# Patient Record
Sex: Female | Born: 1965 | Race: Black or African American | Hispanic: No | Smoking: Never smoker
Health system: Southern US, Community
[De-identification: ages and names within clinical notes are randomized; demographics above are authoritative.]

## PROBLEM LIST (undated history)

## (undated) DIAGNOSIS — E039 Hypothyroidism, unspecified: Secondary | ICD-10-CM

## (undated) DIAGNOSIS — I10 Essential (primary) hypertension: Secondary | ICD-10-CM

## (undated) DIAGNOSIS — E079 Disorder of thyroid, unspecified: Secondary | ICD-10-CM

---

## 2014-02-06 DIAGNOSIS — R7989 Other specified abnormal findings of blood chemistry: Secondary | ICD-10-CM

## 2014-02-06 DIAGNOSIS — E785 Hyperlipidemia, unspecified: Secondary | ICD-10-CM | POA: Insufficient documentation

## 2014-02-06 DIAGNOSIS — E039 Hypothyroidism, unspecified: Secondary | ICD-10-CM | POA: Insufficient documentation

## 2014-02-06 DIAGNOSIS — I10 Essential (primary) hypertension: Secondary | ICD-10-CM | POA: Insufficient documentation

## 2019-08-08 DIAGNOSIS — E6609 Other obesity due to excess calories: Secondary | ICD-10-CM | POA: Insufficient documentation

## 2021-03-18 ENCOUNTER — Emergency Department (HOSPITAL_BASED_OUTPATIENT_CLINIC_OR_DEPARTMENT_OTHER): Payer: Managed Care, Other (non HMO)

## 2021-03-18 ENCOUNTER — Other Ambulatory Visit: Payer: Self-pay

## 2021-03-18 ENCOUNTER — Encounter (HOSPITAL_BASED_OUTPATIENT_CLINIC_OR_DEPARTMENT_OTHER): Payer: Self-pay

## 2021-03-18 ENCOUNTER — Inpatient Hospital Stay (HOSPITAL_BASED_OUTPATIENT_CLINIC_OR_DEPARTMENT_OTHER)
Admission: EM | Admit: 2021-03-18 | Discharge: 2021-03-22 | DRG: 690 | Disposition: A | Discharge status: Home / Self Care | Payer: Managed Care, Other (non HMO) | Attending: Internal Medicine | Admitting: Internal Medicine

## 2021-03-18 DIAGNOSIS — Z1612 Extended spectrum beta lactamase (ESBL) resistance: Secondary | ICD-10-CM | POA: Diagnosis present

## 2021-03-18 DIAGNOSIS — R112 Nausea with vomiting, unspecified: Secondary | ICD-10-CM

## 2021-03-18 DIAGNOSIS — E039 Hypothyroidism, unspecified: Secondary | ICD-10-CM | POA: Diagnosis present

## 2021-03-18 DIAGNOSIS — E876 Hypokalemia: Secondary | ICD-10-CM | POA: Diagnosis present

## 2021-03-18 DIAGNOSIS — N179 Acute kidney failure, unspecified: Secondary | ICD-10-CM | POA: Diagnosis present

## 2021-03-18 DIAGNOSIS — I10 Essential (primary) hypertension: Secondary | ICD-10-CM | POA: Diagnosis present

## 2021-03-18 DIAGNOSIS — B9620 Unspecified Escherichia coli [E. coli] as the cause of diseases classified elsewhere: Secondary | ICD-10-CM | POA: Diagnosis present

## 2021-03-18 DIAGNOSIS — Z20822 Contact with and (suspected) exposure to covid-19: Secondary | ICD-10-CM | POA: Diagnosis present

## 2021-03-18 DIAGNOSIS — Z79890 Hormone replacement therapy (postmenopausal): Secondary | ICD-10-CM

## 2021-03-18 DIAGNOSIS — N12 Tubulo-interstitial nephritis, not specified as acute or chronic: Principal | ICD-10-CM | POA: Diagnosis present

## 2021-03-18 DIAGNOSIS — R7989 Other specified abnormal findings of blood chemistry: Secondary | ICD-10-CM

## 2021-03-18 DIAGNOSIS — R7401 Elevation of levels of liver transaminase levels: Secondary | ICD-10-CM | POA: Diagnosis present

## 2021-03-18 DIAGNOSIS — E860 Dehydration: Secondary | ICD-10-CM | POA: Diagnosis present

## 2021-03-18 DIAGNOSIS — Z79899 Other long term (current) drug therapy: Secondary | ICD-10-CM

## 2021-03-18 DIAGNOSIS — E871 Hypo-osmolality and hyponatremia: Secondary | ICD-10-CM | POA: Diagnosis present

## 2021-03-18 DIAGNOSIS — A499 Bacterial infection, unspecified: Secondary | ICD-10-CM

## 2021-03-18 HISTORY — DX: Hypothyroidism, unspecified: E03.9

## 2021-03-18 HISTORY — DX: Essential (primary) hypertension: I10

## 2021-03-18 HISTORY — DX: Disorder of thyroid, unspecified: E07.9

## 2021-03-18 LAB — URINALYSIS, ROUTINE W REFLEX MICROSCOPIC
Bilirubin Urine: NEGATIVE
Glucose, UA: NEGATIVE mg/dL
Ketones, ur: 40 mg/dL — AB
Nitrite: POSITIVE — AB
Protein, ur: 100 mg/dL — AB
Specific Gravity, Urine: 1.01 (ref 1.005–1.030)
pH: 5.5 (ref 5.0–8.0)

## 2021-03-18 LAB — CBC WITH DIFFERENTIAL/PLATELET
Abs Immature Granulocytes: 0.1 10*3/uL — ABNORMAL HIGH (ref 0.00–0.07)
Basophils Absolute: 0.1 10*3/uL (ref 0.0–0.1)
Basophils Relative: 0 %
Eosinophils Absolute: 0 10*3/uL (ref 0.0–0.5)
Eosinophils Relative: 0 %
HCT: 38.5 % (ref 36.0–46.0)
Hemoglobin: 12.8 g/dL (ref 12.0–15.0)
Immature Granulocytes: 1 %
Lymphocytes Relative: 15 %
Lymphs Abs: 3.1 10*3/uL (ref 0.7–4.0)
MCH: 25.7 pg — ABNORMAL LOW (ref 26.0–34.0)
MCHC: 33.2 g/dL (ref 30.0–36.0)
MCV: 77.3 fL — ABNORMAL LOW (ref 80.0–100.0)
Monocytes Absolute: 2.7 10*3/uL — ABNORMAL HIGH (ref 0.1–1.0)
Monocytes Relative: 13 %
Neutro Abs: 14.6 10*3/uL — ABNORMAL HIGH (ref 1.7–7.7)
Neutrophils Relative %: 71 %
Platelets: 369 10*3/uL (ref 150–400)
RBC: 4.98 MIL/uL (ref 3.87–5.11)
RDW: 14 % (ref 11.5–15.5)
WBC: 20.6 10*3/uL — ABNORMAL HIGH (ref 4.0–10.5)
nRBC: 0 % (ref 0.0–0.2)

## 2021-03-18 LAB — COMPREHENSIVE METABOLIC PANEL
ALT: 56 U/L — ABNORMAL HIGH (ref 0–44)
AST: 68 U/L — ABNORMAL HIGH (ref 15–41)
Albumin: 3.8 g/dL (ref 3.5–5.0)
Alkaline Phosphatase: 67 U/L (ref 38–126)
Anion gap: 16 — ABNORMAL HIGH (ref 5–15)
BUN: 9 mg/dL (ref 6–20)
CO2: 22 mmol/L (ref 22–32)
Calcium: 9.1 mg/dL (ref 8.9–10.3)
Chloride: 91 mmol/L — ABNORMAL LOW (ref 98–111)
Creatinine, Ser: 1.1 mg/dL — ABNORMAL HIGH (ref 0.44–1.00)
GFR, Estimated: 59 mL/min — ABNORMAL LOW (ref >60)
Glucose, Bld: 112 mg/dL — ABNORMAL HIGH (ref 70–99)
Potassium: 2.8 mmol/L — ABNORMAL LOW (ref 3.5–5.1)
Sodium: 129 mmol/L — ABNORMAL LOW (ref 135–145)
Total Bilirubin: 0.6 mg/dL (ref 0.3–1.2)
Total Protein: 8.5 g/dL — ABNORMAL HIGH (ref 6.5–8.1)

## 2021-03-18 LAB — RESP PANEL BY RT-PCR (FLU A&B, COVID) ARPGX2
Influenza A by PCR: NEGATIVE
Influenza B by PCR: NEGATIVE
SARS Coronavirus 2 by RT PCR: NEGATIVE

## 2021-03-18 LAB — URINALYSIS, MICROSCOPIC (REFLEX): WBC, UA: >50 WBC/hpf (ref 0–5)

## 2021-03-18 LAB — MAGNESIUM: Magnesium: 2.1 mg/dL (ref 1.7–2.4)

## 2021-03-18 LAB — LIPASE, BLOOD: Lipase: 23 U/L (ref 11–51)

## 2021-03-18 MED ORDER — MORPHINE SULFATE (PF) 4 MG/ML IV SOLN
4.0000 mg | Freq: Once | INTRAVENOUS | Status: AC
  Administered 2021-03-18: 4 mg via INTRAVENOUS
  Filled 2021-03-18: qty 1

## 2021-03-18 MED ORDER — MAGNESIUM OXIDE -MG SUPPLEMENT 400 (240 MG) MG PO TABS
400.0000 mg | ORAL_TABLET | Freq: Once | ORAL | Status: AC
  Administered 2021-03-18: 400 mg via ORAL
  Filled 2021-03-18: qty 1

## 2021-03-18 MED ORDER — SODIUM CHLORIDE 0.9 % IV BOLUS
1000.0000 mL | Freq: Once | INTRAVENOUS | Status: AC
  Administered 2021-03-18: 1000 mL via INTRAVENOUS

## 2021-03-18 MED ORDER — POTASSIUM CHLORIDE 10 MEQ/100ML IV SOLN
10.0000 meq | Freq: Once | INTRAVENOUS | Status: AC
  Administered 2021-03-18: 10 meq via INTRAVENOUS
  Filled 2021-03-18: qty 100

## 2021-03-18 MED ORDER — IOHEXOL 300 MG/ML  SOLN
100.0000 mL | Freq: Once | INTRAMUSCULAR | Status: AC | PRN
  Administered 2021-03-18: 100 mL via INTRAVENOUS

## 2021-03-18 MED ORDER — DICYCLOMINE HCL 10 MG PO CAPS
10.0000 mg | ORAL_CAPSULE | Freq: Once | ORAL | Status: AC
  Administered 2021-03-18: 10 mg via ORAL
  Filled 2021-03-18: qty 1

## 2021-03-18 MED ORDER — POTASSIUM CHLORIDE CRYS ER 20 MEQ PO TBCR
40.0000 meq | EXTENDED_RELEASE_TABLET | Freq: Once | ORAL | Status: AC
  Administered 2021-03-18: 40 meq via ORAL
  Filled 2021-03-18: qty 2

## 2021-03-18 MED ORDER — ONDANSETRON HCL 4 MG/2ML IJ SOLN
4.0000 mg | Freq: Once | INTRAMUSCULAR | Status: AC
  Administered 2021-03-18: 4 mg via INTRAVENOUS
  Filled 2021-03-18: qty 2

## 2021-03-18 MED ORDER — SODIUM CHLORIDE 0.9 % IV SOLN
2.0000 g | Freq: Once | INTRAVENOUS | Status: AC
  Administered 2021-03-18: 2 g via INTRAVENOUS
  Filled 2021-03-18: qty 20

## 2021-03-18 MED ORDER — FAMOTIDINE IN NACL 20-0.9 MG/50ML-% IV SOLN
20.0000 mg | Freq: Once | INTRAVENOUS | Status: AC
  Administered 2021-03-18: 20 mg via INTRAVENOUS
  Filled 2021-03-18: qty 50

## 2021-03-18 NOTE — ED Triage Notes (Signed)
Pt c/o abd cramps, n/v x 4 days-denies diarrhea-NAD-steady gait

## 2021-03-18 NOTE — ED Notes (Signed)
Pt gone to CT 

## 2021-03-18 NOTE — ED Provider Notes (Signed)
[facility] EMERGENCY DEPARTMENT Provider Note   CSN: [id number] Arrival date & time: 03/18/21  2111     History Chief Complaint  Patient presents with   Abdominal Pain    Kortlynn Poust is a 55 y.o. female.  This is a 55 y.o. female with significant medical history as below, including htn who presents to the ED with complaint of abdominal pain, cramping, nausea or vomiting.  Location: Abdomen diffusely Duration: 1 week Onset:  sudden Timing:  intermittent Description: Cramping, aching Severity: Mild Exacerbating/Alleviating Factors: Worse with oral intake. Associated Symptoms: Intermittent fevers and chills, nausea and vomiting, reduced oral intake over the past 24 hours.  Patient feels nauseated approximately 2 to 3 minutes after oral intake, malaise  Pertinent Negatives:  no cp/ dyspnea, change to urination, no HA,  neuro changes Context: patient thinks she may have eaten something that set off her symptoms, no recent travel or sick contacts.    The history is provided by the patient. No language interpreter was used.  Abdominal Pain Associated symptoms: chills, constipation, fatigue, fever, nausea and vomiting   Associated symptoms: no chest pain, no cough, no hematuria and no shortness of breath       Past Medical History:  Diagnosis Date   Hypertension    Hypothyroid    Thyroid disease     There are no problems to display for this patient.   Past Surgical History:  Procedure Laterality Date   CESAREAN SECTION       OB History   No obstetric history on file.     No family history on file.  Social History   Tobacco Use   Smoking status: Never   Smokeless tobacco: Never  Vaping Use   Vaping Use: Never used  Substance Use Topics   Alcohol use: Never   Drug use: Never    Home Medications Prior to Admission medications   Not on File    Allergies    Patient has no known allergies.  Review of Systems   Review of Systems   Constitutional:  Positive for chills, fatigue and fever.  HENT:  Negative for facial swelling and trouble swallowing.   Eyes:  Negative for photophobia and visual disturbance.  Respiratory:  Negative for cough and shortness of breath.   Cardiovascular:  Negative for chest pain and palpitations.  Gastrointestinal:  Positive for abdominal pain, constipation, nausea and vomiting.  Endocrine: Negative for polydipsia and polyuria.  Genitourinary:  Negative for difficulty urinating and hematuria.  Musculoskeletal:  Negative for gait problem and joint swelling.  Skin:  Negative for pallor and rash.  Neurological:  Negative for syncope and headaches.  Psychiatric/Behavioral:  Negative for agitation and confusion.    Physical Exam Updated Vital Signs BP 139/84   Pulse 80   Temp 99 F (37.2 C) (Oral)   Resp 18   Ht 5\' 6"  (1.676 m)   Wt 95.7 kg   SpO2 100%   BMI 34.06 kg/m   Physical Exam Vitals and nursing note reviewed.  Constitutional:      General: She is not in acute distress.    Appearance: Normal appearance. She is well-developed.  HENT:     Head: Normocephalic and atraumatic.     Right Ear: External ear normal.     Left Ear: External ear normal.     Nose: Nose normal.     Mouth/Throat:     Mouth: Mucous membranes are moist.  Eyes:     General: No  scleral icterus.       Right eye: No discharge.        Left eye: No discharge.  Cardiovascular:     Rate and Rhythm: Normal rate and regular rhythm.     Pulses: Normal pulses.     Heart sounds: Normal heart sounds.  Pulmonary:     Effort: Pulmonary effort is normal. No respiratory distress.     Breath sounds: Normal breath sounds.  Abdominal:     General: Abdomen is flat.     Tenderness: There is generalized abdominal tenderness. There is right CVA tenderness and left CVA tenderness.  Musculoskeletal:        General: Normal range of motion.     Cervical back: Normal range of motion.     Right lower leg: No edema.      Left lower leg: No edema.  Skin:    General: Skin is warm and dry.     Capillary Refill: Capillary refill takes less than 2 seconds.  Neurological:     Mental Status: She is alert.  Psychiatric:        Mood and Affect: Mood normal.        Behavior: Behavior normal.    ED Results / Procedures / Treatments   Labs (all labs ordered are listed, but only abnormal results are displayed) Labs Reviewed  CBC WITH DIFFERENTIAL/PLATELET - Abnormal; Notable for the following components:      Result Value   WBC 20.6 (*)    MCV 77.3 (*)    MCH 25.7 (*)    Neutro Abs 14.6 (*)    Monocytes Absolute 2.7 (*)    Abs Immature Granulocytes 0.10 (*)    All other components within normal limits  COMPREHENSIVE METABOLIC PANEL - Abnormal; Notable for the following components:   Sodium 129 (*)    Potassium 2.8 (*)    Chloride 91 (*)    Glucose, Bld 112 (*)    Creatinine, Ser 1.10 (*)    Total Protein 8.5 (*)    AST 68 (*)    ALT 56 (*)    GFR, Estimated 59 (*)    Anion gap 16 (*)    All other components within normal limits  URINALYSIS, ROUTINE W REFLEX MICROSCOPIC - Abnormal; Notable for the following components:   APPearance CLOUDY (*)    Hgb urine dipstick MODERATE (*)    Ketones, ur 40 (*)    Protein, ur 100 (*)    Nitrite POSITIVE (*)    Leukocytes,Ua LARGE (*)    All other components within normal limits  URINALYSIS, MICROSCOPIC (REFLEX) - Abnormal; Notable for the following components:   Bacteria, UA MANY (*)    All other components within normal limits  RESP PANEL BY RT-PCR (FLU A&B, COVID) ARPGX2  URINE CULTURE  LIPASE, BLOOD  MAGNESIUM    EKG None  Radiology CT ABDOMEN PELVIS W CONTRAST  Result Date: 03/18/2021 CLINICAL DATA:  Acute abdominal pain. EXAM: CT ABDOMEN AND PELVIS WITH CONTRAST TECHNIQUE: Multidetector CT imaging of the abdomen and pelvis was performed using the standard protocol following bolus administration of intravenous contrast. CONTRAST:  OMNIPAQUE  IOHEXOL 300 MG/ML  SOLN COMPARISON:  None. FINDINGS: Lower chest: No acute abnormality. Hepatobiliary: No focal liver abnormality is seen. No gallstones, gallbladder wall thickening, or biliary dilatation. Pancreas: Unremarkable. No pancreatic ductal dilatation or surrounding inflammatory changes. Spleen: Normal in size without focal abnormality. Adrenals/Urinary Tract: On delayed imaging there is minimal patchy cortical hypodensity in both  kidneys. There is mild left perinephric fat stranding. There is mild bilateral ureteral enhancement. There is no hydronephrosis or perinephric fluid collection. The bladder is within normal limits. Stomach/Bowel: Stomach is within normal limits. Appendix is not visualized. No evidence of bowel wall thickening, distention, or inflammatory changes. Vascular/Lymphatic: No significant vascular findings are present. No enlarged abdominal or pelvic lymph nodes. Reproductive: Uterus and bilateral adnexa are unremarkable. Other: No abdominal wall hernia or abnormality. No abdominopelvic ascites. Musculoskeletal: No acute or significant osseous findings. IMPRESSION: 1. Findings suggestive of bilateral pyelonephritis. No hydronephrosis. If there are no clinical signs for infection, infiltrating renal lesions are not excluded. Electronically Signed   By: Darliss Cheney M.D.   On: 03/18/2021 22:50    Procedures .Critical Care Performed by: Sloan Leiter, DO Authorized by: Sloan Leiter, DO   Critical care provider statement:    Critical care time (minutes):  33   Critical care time was exclusive of:  Separately billable procedures and treating other patients   Critical care was necessary to treat or prevent imminent or life-threatening deterioration of the following conditions:  Dehydration   Critical care was time spent personally by me on the following activities:  Ordering and performing treatments and interventions, ordering and review of laboratory studies, ordering and  review of radiographic studies, pulse oximetry, re-evaluation of patient's condition, review of old charts, obtaining history from patient or surrogate, examination of patient, evaluation of patient's response to treatment and development of treatment plan with patient or surrogate   Care discussed with: admitting provider     Medications Ordered in ED Medications  potassium chloride 10 mEq in 100 mL IVPB (10 mEq Intravenous New Bag/Given 03/18/21 2332)  sodium chloride 0.9 % bolus 1,000 mL (0 mLs Intravenous Stopped 03/18/21 2253)  famotidine (PEPCID) IVPB 20 mg premix (0 mg Intravenous Stopped 03/18/21 2253)  ondansetron (ZOFRAN) injection 4 mg (4 mg Intravenous Given 03/18/21 2203)  dicyclomine (BENTYL) capsule 10 mg (10 mg Oral Given 03/18/21 2203)  morphine 4 MG/ML injection 4 mg (4 mg Intravenous Given 03/18/21 2204)  cefTRIAXone (ROCEPHIN) 2 g in sodium chloride 0.9 % 100 mL IVPB (2 g Intravenous New Bag/Given 03/18/21 2248)  iohexol (OMNIPAQUE) 300 MG/ML solution 100 mL (100 mLs Intravenous Contrast Given 03/18/21 2237)  potassium chloride SA (KLOR-CON) CR tablet 40 mEq (40 mEq Oral Given 03/18/21 2330)  magnesium oxide (MAG-OX) tablet 400 mg (400 mg Oral Given 03/18/21 2331)  sodium chloride 0.9 % bolus 1,000 mL (1,000 mLs Intravenous New Bag/Given 03/18/21 2331)    ED Course  I have reviewed the triage vital signs and the nursing notes.  Pertinent labs & imaging results that were available during my care of the patient were reviewed by me and considered in my medical decision making (see chart for details).    MDM Rules/Calculators/A&P                           CC: abd pain, n/v  This patient complains of symptoms as above; this involves an extensive number of treatment options and is a complaint that carries with it a high risk of complications and morbidity. Vital signs were reviewed. Serious etiologies considered.  Abdomen is soft, not peritoneal.  Record review:   Previous records obtained and reviewed   Additional history obtained from son at bedside  Work up as above, notable for:  Labs & imaging results that were available during my care of the patient  were reviewed by me and considered in my medical decision making.   Significant leukocytosis 20.6, bandemia.  Mild hyponatremia, hypokalemia, mild transaminitis. Anion gap noted, ketones in urine. Pt with poor oral intake over the last few days. Continue IVF.   I ordered imaging studies which included CT abdomen pelvis with IV contrast and I independently visualized and interpreted imaging which showed b/l pyelonephritis   Urinalysis consistent with infection.  Given CT findings there is concern for bilateral pyelonephritis  Management: Patient given Rocephin, IV fluids x2 L.   Reassessment:  Reports she is feeling better after intervention, IV fluids, analgesics and antiemetics.  Discussed laboratory and imaging findings.  Recommend admission at this time for pyelonephritis given patient with poor oral intake for the past few days, abnormal lab findings, dehydration.  Significant difficulty tolerating oral intake.  Patient is agreeable. Plan for admission.      This chart was dictated using voice recognition software.  Despite best efforts to proofread,  errors can occur which can change the documentation meaning.  Final Clinical Impression(s) / ED Diagnoses Final diagnoses:  Pyelonephritis  Hypokalemia  Hyponatremia  Nausea and vomiting, unspecified vomiting type  Transaminitis  Dehydration    Rx / DC Orders ED Discharge Orders     None        Sloan Leiter, DO 03/18/21 2333

## 2021-03-18 NOTE — Plan of Care (Addendum)
55 yo F with bilateral pyelonephritis.  N/V.  WBC 20.6k.  CT shows B pyelo.  Got IVF and rocephin.  [facility] will assume care on arrival to accepting facility. Until arrival, care as per EDP. However, [facility] available 24/7 for questions and assistance.  Nursing staff please page [facility] Admits and Consults [phone]) as soon as the patient arrives the hospital.

## 2021-03-19 DIAGNOSIS — R7401 Elevation of levels of liver transaminase levels: Secondary | ICD-10-CM | POA: Diagnosis present

## 2021-03-19 DIAGNOSIS — A499 Bacterial infection, unspecified: Secondary | ICD-10-CM | POA: Diagnosis not present

## 2021-03-19 DIAGNOSIS — E876 Hypokalemia: Secondary | ICD-10-CM | POA: Diagnosis present

## 2021-03-19 DIAGNOSIS — N12 Tubulo-interstitial nephritis, not specified as acute or chronic: Secondary | ICD-10-CM | POA: Diagnosis present

## 2021-03-19 DIAGNOSIS — E860 Dehydration: Secondary | ICD-10-CM | POA: Diagnosis present

## 2021-03-19 DIAGNOSIS — B9620 Unspecified Escherichia coli [E. coli] as the cause of diseases classified elsewhere: Secondary | ICD-10-CM | POA: Diagnosis present

## 2021-03-19 DIAGNOSIS — N179 Acute kidney failure, unspecified: Secondary | ICD-10-CM | POA: Diagnosis present

## 2021-03-19 DIAGNOSIS — I10 Essential (primary) hypertension: Secondary | ICD-10-CM | POA: Diagnosis present

## 2021-03-19 DIAGNOSIS — Z2239 Carrier of other specified bacterial diseases: Secondary | ICD-10-CM | POA: Diagnosis not present

## 2021-03-19 DIAGNOSIS — E871 Hypo-osmolality and hyponatremia: Secondary | ICD-10-CM | POA: Diagnosis present

## 2021-03-19 DIAGNOSIS — Z79890 Hormone replacement therapy (postmenopausal): Secondary | ICD-10-CM | POA: Diagnosis not present

## 2021-03-19 DIAGNOSIS — R109 Unspecified abdominal pain: Secondary | ICD-10-CM | POA: Diagnosis not present

## 2021-03-19 DIAGNOSIS — E039 Hypothyroidism, unspecified: Secondary | ICD-10-CM | POA: Diagnosis present

## 2021-03-19 DIAGNOSIS — Z79899 Other long term (current) drug therapy: Secondary | ICD-10-CM | POA: Diagnosis not present

## 2021-03-19 DIAGNOSIS — Z1612 Extended spectrum beta lactamase (ESBL) resistance: Secondary | ICD-10-CM | POA: Diagnosis present

## 2021-03-19 DIAGNOSIS — R112 Nausea with vomiting, unspecified: Secondary | ICD-10-CM | POA: Diagnosis not present

## 2021-03-19 DIAGNOSIS — Z20822 Contact with and (suspected) exposure to covid-19: Secondary | ICD-10-CM | POA: Diagnosis present

## 2021-03-19 LAB — CBC WITH DIFFERENTIAL/PLATELET
Abs Immature Granulocytes: 0.08 10*3/uL — ABNORMAL HIGH (ref 0.00–0.07)
Basophils Absolute: 0.1 10*3/uL (ref 0.0–0.1)
Basophils Relative: 0 %
Eosinophils Absolute: 0.3 10*3/uL (ref 0.0–0.5)
Eosinophils Relative: 2 %
HCT: 36.8 % (ref 36.0–46.0)
Hemoglobin: 11.6 g/dL — ABNORMAL LOW (ref 12.0–15.0)
Immature Granulocytes: 1 %
Lymphocytes Relative: 14 %
Lymphs Abs: 2.3 10*3/uL (ref 0.7–4.0)
MCH: 25.3 pg — ABNORMAL LOW (ref 26.0–34.0)
MCHC: 31.5 g/dL (ref 30.0–36.0)
MCV: 80.2 fL (ref 80.0–100.0)
Monocytes Absolute: 2.2 10*3/uL — ABNORMAL HIGH (ref 0.1–1.0)
Monocytes Relative: 13 %
Neutro Abs: 12.1 10*3/uL — ABNORMAL HIGH (ref 1.7–7.7)
Neutrophils Relative %: 70 %
Platelets: 353 10*3/uL (ref 150–400)
RBC: 4.59 MIL/uL (ref 3.87–5.11)
RDW: 14.6 % (ref 11.5–15.5)
WBC: 17 10*3/uL — ABNORMAL HIGH (ref 4.0–10.5)
nRBC: 0 % (ref 0.0–0.2)

## 2021-03-19 LAB — MAGNESIUM: Magnesium: 2.2 mg/dL (ref 1.7–2.4)

## 2021-03-19 LAB — COMPREHENSIVE METABOLIC PANEL
ALT: 49 U/L — ABNORMAL HIGH (ref 0–44)
AST: 55 U/L — ABNORMAL HIGH (ref 15–41)
Albumin: 3.4 g/dL — ABNORMAL LOW (ref 3.5–5.0)
Alkaline Phosphatase: 58 U/L (ref 38–126)
Anion gap: 9 (ref 5–15)
BUN: 7 mg/dL (ref 6–20)
CO2: 23 mmol/L (ref 22–32)
Calcium: 8.6 mg/dL — ABNORMAL LOW (ref 8.9–10.3)
Chloride: 103 mmol/L (ref 98–111)
Creatinine, Ser: 0.92 mg/dL (ref 0.44–1.00)
GFR, Estimated: >60 mL/min (ref >60)
Glucose, Bld: 119 mg/dL — ABNORMAL HIGH (ref 70–99)
Potassium: 3.8 mmol/L (ref 3.5–5.1)
Sodium: 135 mmol/L (ref 135–145)
Total Bilirubin: 0.3 mg/dL (ref 0.3–1.2)
Total Protein: 7.7 g/dL (ref 6.5–8.1)

## 2021-03-19 MED ORDER — ONDANSETRON HCL 4 MG PO TABS: 4.0000 mg | ORAL_TABLET | Freq: Four times a day (QID) | ORAL | Status: DC | PRN

## 2021-03-19 MED ORDER — ACETAMINOPHEN 325 MG PO TABS: 650.0000 mg | ORAL_TABLET | Freq: Four times a day (QID) | ORAL | Status: DC | PRN

## 2021-03-19 MED ORDER — SODIUM CHLORIDE 0.9 % IV SOLN
2.0000 g | INTRAVENOUS | Status: DC
  Administered 2021-03-19 – 2021-03-20 (×2): 2 g via INTRAVENOUS
  Filled 2021-03-19 (×2): qty 20

## 2021-03-19 MED ORDER — MORPHINE SULFATE (PF) 2 MG/ML IV SOLN: 2.0000 mg | INTRAVENOUS | Status: DC | PRN

## 2021-03-19 MED ORDER — MORPHINE SULFATE (PF) 4 MG/ML IV SOLN
4.0000 mg | Freq: Once | INTRAVENOUS | Status: AC
Start: 2021-03-19 — End: 2021-03-19
  Administered 2021-03-19: 4 mg via INTRAVENOUS
  Filled 2021-03-19: qty 1

## 2021-03-19 MED ORDER — LACTATED RINGERS IV SOLN: INTRAVENOUS | Status: DC

## 2021-03-19 MED ORDER — HYDRALAZINE HCL 25 MG PO TABS
25.0000 mg | ORAL_TABLET | Freq: Three times a day (TID) | ORAL | Status: DC | PRN
  Administered 2021-03-21: 25 mg via ORAL
  Filled 2021-03-19: qty 1

## 2021-03-19 MED ORDER — ONDANSETRON HCL 4 MG/2ML IJ SOLN
4.0000 mg | Freq: Once | INTRAMUSCULAR | Status: AC
  Administered 2021-03-19: 4 mg via INTRAVENOUS
  Filled 2021-03-19: qty 2

## 2021-03-19 MED ORDER — OXYCODONE HCL 5 MG PO TABS
5.0000 mg | ORAL_TABLET | ORAL | Status: DC | PRN
Start: 2021-03-19 — End: 2021-03-22
  Administered 2021-03-19 – 2021-03-21 (×5): 5 mg via ORAL
  Filled 2021-03-19 (×5): qty 1

## 2021-03-19 MED ORDER — SODIUM CHLORIDE 0.9 % IV SOLN: INTRAVENOUS | Status: DC

## 2021-03-19 MED ORDER — MELATONIN 3 MG PO TABS
3.0000 mg | ORAL_TABLET | Freq: Every day | ORAL | Status: DC
  Administered 2021-03-19 – 2021-03-21 (×3): 3 mg via ORAL
  Filled 2021-03-19 (×3): qty 1

## 2021-03-19 MED ORDER — ONDANSETRON HCL 4 MG/2ML IJ SOLN: 4.0000 mg | Freq: Four times a day (QID) | INTRAMUSCULAR | Status: DC | PRN

## 2021-03-19 MED ORDER — ACETAMINOPHEN 650 MG RE SUPP: 650.0000 mg | Freq: Four times a day (QID) | RECTAL | Status: DC | PRN

## 2021-03-19 MED ORDER — ENOXAPARIN SODIUM 40 MG/0.4ML IJ SOSY
40.0000 mg | PREFILLED_SYRINGE | INTRAMUSCULAR | Status: DC
  Filled 2021-03-19: qty 0.4

## 2021-03-19 NOTE — ED Notes (Signed)
Pt up to bathroom and back to bed.

## 2021-03-19 NOTE — H&P (Signed)
History and Physical    Michelle Garner SFK:[id number] DOB: 03-18-1966 DOA: 03/18/2021  PCP: Pcp, No  Patient coming from: Home  Chief Complaint: N/V, abdominal pain  HPI: Michelle Garner is a 55 y.o. female with medical history significant of HTN, hypothyroidism. Presenting with N/V and abdominal pain. She reports that she had UTI symptoms about a month ago. She decided to treat those symptoms at home with cranberry juice. She seemed to get better. However, about a week ago, she started having problems with N/V and abdominal pain. She describes a lower abdominal cramping. She has been very nauseous with these events and had a few episodes of vomiting. Her PO intake has been poor. She tried APAP but it didn't help. Her symptoms progressed through yesterday to include chills/sweats at home. She decided she needed to come to the ED for help. She denies any other aggravating or alleviating factors.     ED Course: CT ab/pelvis showed b/l pyelonephritis. She was started on rocephin. [facility] was called for admission.   Review of Systems:  Denies CP, dyspnea, lightheadedness, dizziness, diarrhea, hematuria, hematemesis, hematochezia. Review of systems is otherwise negative for all not mentioned in HPI.   PMHx Past Medical History:  Diagnosis Date   Hypertension    Hypothyroid    Thyroid disease     PSHx Past Surgical History:  Procedure Laterality Date   CESAREAN SECTION      SocHx  reports that she has never smoked. She has never used smokeless tobacco. She reports that she does not drink alcohol and does not use drugs.  No Known Allergies  FamHx No family history on file.  Prior to Admission medications   Not on File    Physical Exam: Vitals:   03/19/21 1000 03/19/21 1030 03/19/21 1330 03/19/21 1602  BP: 122/81 121/77 119/77 (!) 156/96  Pulse: 70 65 69 71  Resp: (!) 23 20 20 18   Temp:    97.9 F (36.6 C)  TempSrc:    Oral  SpO2: 98% 96% 96% 100%  Weight:       Height:        General: 55 y.o. female resting in bed in NAD Eyes: PERRL, normal sclera ENMT: Nares patent w/o discharge, orophaynx clear, dentition normal, ears w/o discharge/lesions/ulcers Neck: Supple, trachea midline Cardiovascular: RRR, +S1, S2, no m/g/r, equal pulses throughout Respiratory: CTABL, no w/r/r, normal WOB GI: BS+, NDNT, no masses noted, no organomegaly noted MSK: No e/c/c Skin: No rashes, bruises, ulcerations noted Neuro: A&O x 3, no focal deficits Psyc: Appropriate interaction and affect, calm/cooperative  Labs on Admission: I have personally reviewed following labs and imaging studies  CBC: Recent Labs  Lab 03/18/21 2150  WBC 20.6*  NEUTROABS 14.6*  HGB 12.8  HCT 38.5  MCV 77.3*  PLT 369   Basic Metabolic Panel: Recent Labs  Lab 03/18/21 2150  NA 129*  K 2.8*  CL 91*  CO2 22  GLUCOSE 112*  BUN 9  CREATININE 1.10*  CALCIUM 9.1  MG 2.1   GFR: Estimated Creatinine Clearance: 65.8 mL/min (A) (by C-G formula based on SCr of 1.1 mg/dL (H)). Liver Function Tests: Recent Labs  Lab 03/18/21 2150  AST 68*  ALT 56*  ALKPHOS 67  BILITOT 0.6  PROT 8.5*  ALBUMIN 3.8   Recent Labs  Lab 03/18/21 2150  LIPASE 23   No results for input(s): AMMONIA in the last 168 hours. Coagulation Profile: No results for input(s): INR, PROTIME in the last 168  hours. Cardiac Enzymes: No results for input(s): CKTOTAL, CKMB, CKMBINDEX, TROPONINI in the last 168 hours. BNP (last 3 results) No results for input(s): PROBNP in the last 8760 hours. HbA1C: No results for input(s): HGBA1C in the last 72 hours. CBG: No results for input(s): GLUCAP in the last 168 hours. Lipid Profile: No results for input(s): CHOL, HDL, LDLCALC, TRIG, CHOLHDL, LDLDIRECT in the last 72 hours. Thyroid Function Tests: No results for input(s): TSH, T4TOTAL, FREET4, T3FREE, THYROIDAB in the last 72 hours. Anemia Panel: No results for input(s): VITAMINB12, FOLATE, FERRITIN, TIBC, IRON,  RETICCTPCT in the last 72 hours. Urine analysis:    Component Value Date/Time   COLORURINE YELLOW 03/18/2021 2137   APPEARANCEUR CLOUDY (A) 03/18/2021 2137   LABSPEC 1.010 03/18/2021 2137   PHURINE 5.5 03/18/2021 2137   GLUCOSEU NEGATIVE 03/18/2021 2137   HGBUR MODERATE (A) 03/18/2021 2137   BILIRUBINUR NEGATIVE 03/18/2021 2137   KETONESUR 40 (A) 03/18/2021 2137   PROTEINUR 100 (A) 03/18/2021 2137   NITRITE POSITIVE (A) 03/18/2021 2137   LEUKOCYTESUR LARGE (A) 03/18/2021 2137    Radiological Exams on Admission: CT ABDOMEN PELVIS W CONTRAST  Result Date: 03/18/2021 CLINICAL DATA:  Acute abdominal pain. EXAM: CT ABDOMEN AND PELVIS WITH CONTRAST TECHNIQUE: Multidetector CT imaging of the abdomen and pelvis was performed using the standard protocol following bolus administration of intravenous contrast. CONTRAST:  OMNIPAQUE IOHEXOL 300 MG/ML  SOLN COMPARISON:  None. FINDINGS: Lower chest: No acute abnormality. Hepatobiliary: No focal liver abnormality is seen. No gallstones, gallbladder wall thickening, or biliary dilatation. Pancreas: Unremarkable. No pancreatic ductal dilatation or surrounding inflammatory changes. Spleen: Normal in size without focal abnormality. Adrenals/Urinary Tract: On delayed imaging there is minimal patchy cortical hypodensity in both kidneys. There is mild left perinephric fat stranding. There is mild bilateral ureteral enhancement. There is no hydronephrosis or perinephric fluid collection. The bladder is within normal limits. Stomach/Bowel: Stomach is within normal limits. Appendix is not visualized. No evidence of bowel wall thickening, distention, or inflammatory changes. Vascular/Lymphatic: No significant vascular findings are present. No enlarged abdominal or pelvic lymph nodes. Reproductive: Uterus and bilateral adnexa are unremarkable. Other: No abdominal wall hernia or abnormality. No abdominopelvic ascites. Musculoskeletal: No acute or significant osseous  findings. IMPRESSION: 1. Findings suggestive of bilateral pyelonephritis. No hydronephrosis. If there are no clinical signs for infection, infiltrating renal lesions are not excluded. Electronically Signed   By: Darliss Cheney M.D.   On: 03/18/2021 22:50    EKG: None obtained in ED  Assessment/Plan Pyelonephritis     - admit to inpt, med-surg     - continue rocephin     - follow UCx     - pain control, anti-emetics  AKI     - baseline Scr is 0.7; this is likely secondary to above     - CT ab/pelvis w/o hydronephrosis     - fluids, follow  HTN     - PRN hydralazine     - says she's on HCTZ at home; hold for now d/t AKI  Hypothyroidism    - resume home synthroid when confirmed  Hypokalemia     - Mg2+ is ok, replace K+  Elevated LFTs     - mild elevation; check hep panel     - CT ab/pelvis w/o hepatic lesion  DVT prophylaxis: lovenox  Code Status: FULL  Family Communication: None at bedside  Consults called: None   Status is: Inpatient  Remains inpatient appropriate because: severity of illness  Abdurrahman Petersheim  A Absalom Aro DO [facility]  If 7PM-7AM, please contact night-coverage [email or web address]  03/19/2021, 4:10 PM

## 2021-03-19 NOTE — ED Notes (Signed)
Pt provided grits and coffee per request. Pt ambulated to bathroom with steady gait, unassisted.

## 2021-03-20 DIAGNOSIS — N12 Tubulo-interstitial nephritis, not specified as acute or chronic: Secondary | ICD-10-CM | POA: Diagnosis not present

## 2021-03-20 DIAGNOSIS — R112 Nausea with vomiting, unspecified: Secondary | ICD-10-CM | POA: Diagnosis not present

## 2021-03-20 LAB — CBC
HCT: 37.8 % (ref 36.0–46.0)
Hemoglobin: 11.9 g/dL — ABNORMAL LOW (ref 12.0–15.0)
MCH: 25.4 pg — ABNORMAL LOW (ref 26.0–34.0)
MCHC: 31.5 g/dL (ref 30.0–36.0)
MCV: 80.8 fL (ref 80.0–100.0)
Platelets: 318 10*3/uL (ref 150–400)
RBC: 4.68 MIL/uL (ref 3.87–5.11)
RDW: 14.7 % (ref 11.5–15.5)
WBC: 13.7 10*3/uL — ABNORMAL HIGH (ref 4.0–10.5)
nRBC: 0 % (ref 0.0–0.2)

## 2021-03-20 LAB — HEPATITIS PANEL, ACUTE
HCV Ab: NONREACTIVE
Hep A IgM: NONREACTIVE
Hep B C IgM: NONREACTIVE
Hepatitis B Surface Ag: NONREACTIVE

## 2021-03-20 LAB — COMPREHENSIVE METABOLIC PANEL
ALT: 47 U/L — ABNORMAL HIGH (ref 0–44)
AST: 51 U/L — ABNORMAL HIGH (ref 15–41)
Albumin: 3.3 g/dL — ABNORMAL LOW (ref 3.5–5.0)
Alkaline Phosphatase: 63 U/L (ref 38–126)
Anion gap: 10 (ref 5–15)
BUN: 7 mg/dL (ref 6–20)
CO2: 25 mmol/L (ref 22–32)
Calcium: 8.6 mg/dL — ABNORMAL LOW (ref 8.9–10.3)
Chloride: 103 mmol/L (ref 98–111)
Creatinine, Ser: 0.82 mg/dL (ref 0.44–1.00)
GFR, Estimated: >60 mL/min (ref >60)
Glucose, Bld: 94 mg/dL (ref 70–99)
Potassium: 3.6 mmol/L (ref 3.5–5.1)
Sodium: 138 mmol/L (ref 135–145)
Total Bilirubin: 0.4 mg/dL (ref 0.3–1.2)
Total Protein: 7.6 g/dL (ref 6.5–8.1)

## 2021-03-20 LAB — HIV ANTIBODY (ROUTINE TESTING W REFLEX): HIV Screen 4th Generation wRfx: NONREACTIVE

## 2021-03-20 MED ORDER — LEVOTHYROXINE SODIUM 25 MCG PO TABS
137.0000 ug | ORAL_TABLET | Freq: Every morning | ORAL | Status: DC
  Administered 2021-03-22: 137 ug via ORAL
  Filled 2021-03-20: qty 1

## 2021-03-20 MED ORDER — POTASSIUM CHLORIDE CRYS ER 20 MEQ PO TBCR
40.0000 meq | EXTENDED_RELEASE_TABLET | Freq: Once | ORAL | Status: AC
  Administered 2021-03-20: 40 meq via ORAL
  Filled 2021-03-20: qty 2

## 2021-03-20 NOTE — Progress Notes (Addendum)
PROGRESS NOTE  Michelle Garner TYO:[id number] DOB: [birth year] DOA: 03/18/2021 PCP: Pcp, No   LOS: 1 day   Brief Narrative / Interim history: 55 year old female with history of HTN, hypothyroidism comes to the hospital with nausea, vomiting, abdominal pain.  She reported having UTI symptoms about a month ago, drank some cranberry juice with improvement.  About a week ago started having nausea, vomiting, abdominal pain, she initially thought she has gastroenteritis and try to continue supportive treatment.  Eventually she was so symptomatic, was unable to eat and drink anything and came to the hospital.  She was found to have bilateral pyelonephritis, placed on antibiotics and admitted  Subjective / 24h Interval events: She is feeling better this morning, no more nausea or vomiting.  No fevers overnight.  Assessment & Plan: Principal Problem Bilateral pyelonephritis-patient was placed on antibiotics with ceftriaxone, continue.  Clinically seems to be improving, she is feeling better today, WBC went from 20 K on admission to 13 K this morning. -Continue to monitor cultures  Active Problems Acute kidney injury-creatinine has now normalized with fluids.  CT abdomen and pelvis done on admission was without hydronephrosis  Hypokalemia-Repleted, improved  Essential hypertension-continue to hold home medications  Hypothyroidism-resume home Synthroid  Elevated LFTs- mild elevation, hepatitis panel negative.  Stable today  Scheduled Meds:  enoxaparin (LOVENOX) injection  40 mg Subcutaneous Q24H   melatonin  3 mg Oral QHS   Continuous Infusions:  cefTRIAXone (ROCEPHIN)  IV Stopped (03/20/21 0745)   PRN Meds:.acetaminophen **OR** acetaminophen, hydrALAZINE, morphine injection, ondansetron **OR** ondansetron (ZOFRAN) IV, oxyCODONE  Diet Orders (From admission, onward)     Start     Ordered   03/19/21 1630  Diet Heart Room service appropriate? Yes; Fluid consistency: Thin  Diet  effective now       Question Answer Comment  Room service appropriate? Yes   Fluid consistency: Thin      03/19/21 1632            DVT prophylaxis: enoxaparin (LOVENOX) injection 40 mg Start: 03/19/21 2200     Code Status: Full Code  Family Communication: No family at bedside  Status is: Inpatient  Remains inpatient appropriate because: Severity of infection, continue IV antibiotics  Level of care: Med-Surg  Consultants:  None  Procedures:  None   Microbiology  Urine cultures - GNRs  Antimicrobials: Ceftriaxone 10/27 >>    Objective: Vitals:   03/19/21 2029 03/20/21 0208 03/20/21 0552 03/20/21 0932  BP: 137/79 136/73 102/86 140/86  Pulse: 80 70 67 72  Resp: 18 18 18 16   Temp: 99.5 F (37.5 C) 98 F (36.7 C) 98.1 F (36.7 C) 98.1 F (36.7 C)  TempSrc: Oral Oral Oral Oral  SpO2: 100% 100% 99% 99%  Weight:      Height:        Intake/Output Summary (Last 24 hours) at 03/20/2021 1009 Last data filed at 03/20/2021 0600 Gross per 24 hour  Intake 1920 ml  Output 1400 ml  Net 520 ml   Filed Weights   03/18/21 2125  Weight: 95.7 kg    Examination:  Constitutional: NAD Eyes: no scleral icterus ENMT: Mucous membranes are moist.  Neck: normal, supple Respiratory: clear to auscultation bilaterally, no wheezing, no crackles.  Cardiovascular: Regular rate and rhythm, no murmurs / rubs / gallops. No LE edema. Good peripheral pulses Abdomen: non distended, no tenderness. Bowel sounds positive.  Musculoskeletal: no clubbing / cyanosis.  Skin: no rashes Neurologic: non focal    Data Reviewed:  I have independently reviewed following labs and imaging studies   CBC: Recent Labs  Lab 03/18/21 2150 03/19/21 2002 03/20/21 0358  WBC 20.6* 17.0* 13.7*  NEUTROABS 14.6* 12.1*  --   HGB 12.8 11.6* 11.9*  HCT 38.5 36.8 37.8  MCV 77.3* 80.2 80.8  PLT 369 353 318   Basic Metabolic Panel: Recent Labs  Lab 03/18/21 2150 03/19/21 2002 03/20/21 0358  NA  129* 135 138  K 2.8* 3.8 3.6  CL 91* 103 103  CO2 22 23 25   GLUCOSE 112* 119* 94  BUN 9 7 7   CREATININE 1.10* 0.92 0.82  CALCIUM 9.1 8.6* 8.6*  MG 2.1 2.2  --    Liver Function Tests: Recent Labs  Lab 03/18/21 2150 03/19/21 2002 03/20/21 0358  AST 68* 55* 51*  ALT 56* 49* 47*  ALKPHOS 67 58 63  BILITOT 0.6 0.3 0.4  PROT 8.5* 7.7 7.6  ALBUMIN 3.8 3.4* 3.3*   Coagulation Profile: No results for input(s): INR, PROTIME in the last 168 hours. HbA1C: No results for input(s): HGBA1C in the last 72 hours. CBG: No results for input(s): GLUCAP in the last 168 hours.  Recent Results (from the past 240 hour(s))  Resp Panel by RT-PCR (Flu A&B, Covid) Nasopharyngeal Swab     Status: None   Collection Time: 03/18/21  9:50 PM   Specimen: Nasopharyngeal Swab; Nasopharyngeal(NP) swabs in vial transport medium  Result Value Ref Range Status   SARS Coronavirus 2 by RT PCR NEGATIVE NEGATIVE Final    Comment: (NOTE) SARS-CoV-2 target nucleic acids are NOT DETECTED.  The SARS-CoV-2 RNA is generally detectable in upper respiratory specimens during the acute phase of infection. The lowest concentration of SARS-CoV-2 viral copies this assay can detect is 138 copies/mL. A negative result does not preclude SARS-Cov-2 infection and should not be used as the sole basis for treatment or other patient management decisions. A negative result may occur with  improper specimen collection/handling, submission of specimen other than nasopharyngeal swab, presence of viral mutation(s) within the areas targeted by this assay, and inadequate number of viral copies(<138 copies/mL). A negative result must be combined with clinical observations, patient history, and epidemiological information. The expected result is Negative.  Fact Sheet for Patients:  03/22/21  Fact Sheet for Healthcare Providers:  03/20/21  This test is no t yet  approved or cleared by the [employer] FDA and  has been authorized for detection and/or diagnosis of SARS-CoV-2 by FDA under an Emergency Use Authorization (EUA). This EUA will remain  in effect (meaning this test can be used) for the duration of the COVID-19 declaration under Section 564(b)(1) of the Act, 21 U.S.C.section 360bbb-3(b)(1), unless the authorization is terminated  or revoked sooner.       Influenza A by PCR NEGATIVE NEGATIVE Final   Influenza B by PCR NEGATIVE NEGATIVE Final    Comment: (NOTE) The Xpert Xpress SARS-CoV-2/FLU/RSV plus assay is intended as an aid in the diagnosis of influenza from Nasopharyngeal swab specimens and should not be used as a sole basis for treatment. Nasal washings and aspirates are unacceptable for Xpert Xpress SARS-CoV-2/FLU/RSV testing.  Fact Sheet for Patients: SeriousBroker.it  Fact Sheet for Healthcare Providers: [place]  This test is not yet approved or cleared by the [employer] FDA and has been authorized for detection and/or diagnosis of SARS-CoV-2 by FDA under an Emergency Use Authorization (EUA). This EUA will remain in effect (meaning this test can be used) for the duration of the COVID-19  declaration under Section 564(b)(1) of the Act, 21 U.S.C. section 360bbb-3(b)(1), unless the authorization is terminated or revoked.  Performed at [facility], [address] [place], [place] [address]   Urine Culture     Status: Abnormal (Preliminary result)   Collection Time: 03/18/21 10:15 PM   Specimen: Urine, Clean Catch  Result Value Ref Range Status   Specimen Description   Final    URINE, CLEAN CATCH Performed at [facility], [address] [place], [place] [address]    Special Requests   Final    NONE Performed at [facility], [address] [place], [place] [address]    Culture (A)  Final    >=100,000 COLONIES/mL GRAM  NEGATIVE RODS SUSCEPTIBILITIES TO FOLLOW Performed at [facility], [address] [place], [place] [address]    Report Status PENDING  Incomplete     Radiology Studies: No results found.  Pamella Pert, MD, PhD [facility]  Between 7 am - 7 pm I am available, please contact me via Amion (for emergencies) or Securechat (non urgent messages)  Between 7 pm - 7 am I am not available, please contact night coverage MD/APP via Amion

## 2021-03-21 ENCOUNTER — Inpatient Hospital Stay: Payer: Self-pay

## 2021-03-21 DIAGNOSIS — N12 Tubulo-interstitial nephritis, not specified as acute or chronic: Principal | ICD-10-CM

## 2021-03-21 DIAGNOSIS — Z2239 Carrier of other specified bacterial diseases: Secondary | ICD-10-CM

## 2021-03-21 DIAGNOSIS — R112 Nausea with vomiting, unspecified: Secondary | ICD-10-CM | POA: Diagnosis not present

## 2021-03-21 DIAGNOSIS — E860 Dehydration: Secondary | ICD-10-CM

## 2021-03-21 DIAGNOSIS — D72829 Elevated white blood cell count, unspecified: Secondary | ICD-10-CM

## 2021-03-21 DIAGNOSIS — E871 Hypo-osmolality and hyponatremia: Secondary | ICD-10-CM

## 2021-03-21 DIAGNOSIS — R109 Unspecified abdominal pain: Secondary | ICD-10-CM | POA: Diagnosis not present

## 2021-03-21 DIAGNOSIS — E876 Hypokalemia: Secondary | ICD-10-CM

## 2021-03-21 LAB — CBC
HCT: 35.4 % — ABNORMAL LOW (ref 36.0–46.0)
Hemoglobin: 11.3 g/dL — ABNORMAL LOW (ref 12.0–15.0)
MCH: 25.9 pg — ABNORMAL LOW (ref 26.0–34.0)
MCHC: 31.9 g/dL (ref 30.0–36.0)
MCV: 81 fL (ref 80.0–100.0)
Platelets: 355 10*3/uL (ref 150–400)
RBC: 4.37 MIL/uL (ref 3.87–5.11)
RDW: 14.6 % (ref 11.5–15.5)
WBC: 11.7 10*3/uL — ABNORMAL HIGH (ref 4.0–10.5)
nRBC: 0 % (ref 0.0–0.2)

## 2021-03-21 LAB — BASIC METABOLIC PANEL
Anion gap: 11 (ref 5–15)
BUN: 8 mg/dL (ref 6–20)
CO2: 25 mmol/L (ref 22–32)
Calcium: 9.1 mg/dL (ref 8.9–10.3)
Chloride: 102 mmol/L (ref 98–111)
Creatinine, Ser: 0.71 mg/dL (ref 0.44–1.00)
GFR, Estimated: >60 mL/min (ref >60)
Glucose, Bld: 92 mg/dL (ref 70–99)
Potassium: 3.9 mmol/L (ref 3.5–5.1)
Sodium: 138 mmol/L (ref 135–145)

## 2021-03-21 LAB — URINE CULTURE: Culture: >=100000 — AB

## 2021-03-21 MED ORDER — SODIUM CHLORIDE 0.9 % IV SOLN
1.0000 g | Freq: Three times a day (TID) | INTRAVENOUS | Status: DC
  Administered 2021-03-21: 1 g via INTRAVENOUS
  Filled 2021-03-21 (×2): qty 1

## 2021-03-21 MED ORDER — HYDROCHLOROTHIAZIDE 12.5 MG PO TABS
12.5000 mg | ORAL_TABLET | Freq: Every day | ORAL | Status: DC
  Administered 2021-03-21 – 2021-03-22 (×2): 12.5 mg via ORAL
  Filled 2021-03-21 (×2): qty 1

## 2021-03-21 MED ORDER — LISINOPRIL 10 MG PO TABS
10.0000 mg | ORAL_TABLET | Freq: Every day | ORAL | Status: DC
  Administered 2021-03-21 – 2021-03-22 (×2): 10 mg via ORAL
  Filled 2021-03-21 (×2): qty 1

## 2021-03-21 MED ORDER — SODIUM CHLORIDE 0.9 % IV SOLN
1.0000 g | INTRAVENOUS | Status: DC
  Administered 2021-03-21: 1000 mg via INTRAVENOUS
  Filled 2021-03-21 (×3): qty 1

## 2021-03-21 MED ORDER — POLYETHYLENE GLYCOL 3350 17 G PO PACK
17.0000 g | PACK | Freq: Every day | ORAL | Status: DC
  Administered 2021-03-21: 17 g via ORAL
  Filled 2021-03-21 (×2): qty 1

## 2021-03-21 MED ORDER — LISINOPRIL-HYDROCHLOROTHIAZIDE 10-12.5 MG PO TABS: 1.0000 | ORAL_TABLET | Freq: Every day | ORAL | Status: DC

## 2021-03-21 NOTE — Progress Notes (Signed)
Spoke with RN re PICC order.  Current plan to place PICC Sunday am.  No discharge orders at this time and no CSW note related to home medication set up.  Dahlia Client RN to notify PICC RN if changes are made.

## 2021-03-21 NOTE — Plan of Care (Signed)
  Problem: Pain Managment: Goal: General experience of comfort will improve Outcome: Adequate for Discharge   Problem: Safety: Goal: Ability to remain free from injury will improve Outcome: Adequate for Discharge   Problem: Skin Integrity: Goal: Risk for impaired skin integrity will decrease Outcome: Adequate for Discharge   Problem: Urinary Elimination: Goal: Signs and symptoms of infection will decrease Outcome: Adequate for Discharge

## 2021-03-21 NOTE — Progress Notes (Signed)
PHARMACY CONSULT NOTE FOR:  OUTPATIENT  PARENTERAL ANTIBIOTIC THERAPY (OPAT)  Indication: Bilateral pyelonephritis, ESBL Ecoli in urine Regimen: Ertapenem 1g IV q24 hours End date: 03/28/21  IV antibiotic discharge orders are pended. To discharging provider:  please sign these orders via discharge navigator,  Select New Orders & click on the button choice - Manage This Unsigned Work.     Thank you for allowing pharmacy to be a part of this patient's care.  Rexford Maus, PharmD 03/21/2021 10:47 AM

## 2021-03-21 NOTE — Progress Notes (Signed)
PROGRESS NOTE  Michelle Garner NWG:[id number] DOB: 1966/02/02 DOA: 03/18/2021 PCP: Pcp, No   LOS: 2 days   Brief Narrative / Interim history: 55 year old female with history of HTN, hypothyroidism comes to the hospital with nausea, vomiting, abdominal pain.  She reported having UTI symptoms about a month ago, drank some cranberry juice with improvement.  About a week ago started having nausea, vomiting, abdominal pain, she initially thought she has gastroenteritis and try to continue supportive treatment.  Eventually she was so symptomatic, was unable to eat and drink anything and came to the hospital.  She was found to have bilateral pyelonephritis, placed on antibiotics and admitted  Subjective / 24h Interval events: She is still having some flank pain  Assessment & Plan: Principal Problem Bilateral pyelonephritis-she was initially placed on antibiotics ceftriaxone, clinically improving but still with some residual symptoms.  Culture this morning unfortunately speciated ESBL E. coli.  Switch antibiotics to meropenem.  Given severity of disease, bilateral pyelonephritis I would favor doing 7 days of ertapenem -PICC line, home health pending  Active Problems Acute kidney injury-creatinine has now normalized with fluids.  CT abdomen and pelvis done on admission was without hydronephrosis  Hypokalemia-Repleted, improved  Essential hypertension-continue to hold home medications  Hypothyroidism-resume home Synthroid  Elevated LFTs- mild elevation, hepatitis panel negative.  Stable today  Scheduled Meds:  enoxaparin (LOVENOX) injection  40 mg Subcutaneous Q24H   levothyroxine  137 mcg Oral q morning   melatonin  3 mg Oral QHS   polyethylene glycol  17 g Oral Daily   Continuous Infusions:  meropenem (MERREM) IV 1 g (03/21/21 1115)   PRN Meds:.acetaminophen **OR** acetaminophen, hydrALAZINE, morphine injection, ondansetron **OR** ondansetron (ZOFRAN) IV, oxyCODONE  Diet Orders  (From admission, onward)     Start     Ordered   03/19/21 1630  Diet Heart Room service appropriate? Yes; Fluid consistency: Thin  Diet effective now       Question Answer Comment  Room service appropriate? Yes   Fluid consistency: Thin      03/19/21 1632            DVT prophylaxis: enoxaparin (LOVENOX) injection 40 mg Start: 03/19/21 2200     Code Status: Full Code  Family Communication: No family at bedside  Status is: Inpatient  Remains inpatient appropriate because: Severity of infection, continue IV antibiotics  Level of care: Med-Surg  Consultants:  None  Procedures:  None   Microbiology  Urine cultures - GNRs  Antimicrobials: Ceftriaxone 10/27 >>    Objective: Vitals:   03/20/21 0932 03/20/21 1317 03/20/21 2115 03/21/21 0517  BP: 140/86 (!) 144/90 (!) 166/92 (!) 145/87  Pulse: 72 60 78 62  Resp: 16 16  18   Temp: 98.1 F (36.7 C) 97.8 F (36.6 C) 98.5 F (36.9 C) 98.1 F (36.7 C)  TempSrc: Oral Oral Oral Oral  SpO2: 99% 100% 100% 97%  Weight:      Height:        Intake/Output Summary (Last 24 hours) at 03/21/2021 1227 Last data filed at 03/21/2021 1000 Gross per 24 hour  Intake 2140 ml  Output 1500 ml  Net 640 ml    Filed Weights   03/18/21 2125  Weight: 95.7 kg    Examination:  Constitutional: No distress, comfortable Eyes: anicteric ENMT: mmm  Neck: normal, supple Respiratory: CTA bilaterally, no wheezing, no crackles Cardiovascular: Regular rate and rhythm, no murmurs, no edema Abdomen: Soft, NT, ND, bowel sounds positive Musculoskeletal: no clubbing / cyanosis.  Skin: No rashes seen Neurologic: No focal deficits  Data Reviewed: I have independently reviewed following labs and imaging studies   CBC: Recent Labs  Lab 03/18/21 2150 03/19/21 2002 03/20/21 0358 03/21/21 0436  WBC 20.6* 17.0* 13.7* 11.7*  NEUTROABS 14.6* 12.1*  --   --   HGB 12.8 11.6* 11.9* 11.3*  HCT 38.5 36.8 37.8 35.4*  MCV 77.3* 80.2 80.8 81.0   PLT 369 353 318 355    Basic Metabolic Panel: Recent Labs  Lab 03/18/21 2150 03/19/21 2002 03/20/21 0358 03/21/21 0436  NA 129* 135 138 138  K 2.8* 3.8 3.6 3.9  CL 91* 103 103 102  CO2 22 23 25 25   GLUCOSE 112* 119* 94 92  BUN 9 7 7 8   CREATININE 1.10* 0.92 0.82 0.71  CALCIUM 9.1 8.6* 8.6* 9.1  MG 2.1 2.2  --   --     Liver Function Tests: Recent Labs  Lab 03/18/21 2150 03/19/21 2002 03/20/21 0358  AST 68* 55* 51*  ALT 56* 49* 47*  ALKPHOS 67 58 63  BILITOT 0.6 0.3 0.4  PROT 8.5* 7.7 7.6  ALBUMIN 3.8 3.4* 3.3*    Coagulation Profile: No results for input(s): INR, PROTIME in the last 168 hours. HbA1C: No results for input(s): HGBA1C in the last 72 hours. CBG: No results for input(s): GLUCAP in the last 168 hours.  Recent Results (from the past 240 hour(s))  Resp Panel by RT-PCR (Flu A&B, Covid) Nasopharyngeal Swab     Status: None   Collection Time: 03/18/21  9:50 PM   Specimen: Nasopharyngeal Swab; Nasopharyngeal(NP) swabs in vial transport medium  Result Value Ref Range Status   SARS Coronavirus 2 by RT PCR NEGATIVE NEGATIVE Final    Comment: (NOTE) SARS-CoV-2 target nucleic acids are NOT DETECTED.  The SARS-CoV-2 RNA is generally detectable in upper respiratory specimens during the acute phase of infection. The lowest concentration of SARS-CoV-2 viral copies this assay can detect is 138 copies/mL. A negative result does not preclude SARS-Cov-2 infection and should not be used as the sole basis for treatment or other patient management decisions. A negative result may occur with  improper specimen collection/handling, submission of specimen other than nasopharyngeal swab, presence of viral mutation(s) within the areas targeted by this assay, and inadequate number of viral copies(<138 copies/mL). A negative result must be combined with clinical observations, patient history, and epidemiological information. The expected result is Negative.  Fact Sheet  for Patients:  03/22/21  Fact Sheet for Healthcare Providers:  03/20/21  This test is no t yet approved or cleared by the [employer] FDA and  has been authorized for detection and/or diagnosis of SARS-CoV-2 by FDA under an Emergency Use Authorization (EUA). This EUA will remain  in effect (meaning this test can be used) for the duration of the COVID-19 declaration under Section 564(b)(1) of the Act, 21 U.S.C.section 360bbb-3(b)(1), unless the authorization is terminated  or revoked sooner.       Influenza A by PCR NEGATIVE NEGATIVE Final   Influenza B by PCR NEGATIVE NEGATIVE Final    Comment: (NOTE) The Xpert Xpress SARS-CoV-2/FLU/RSV plus assay is intended as an aid in the diagnosis of influenza from Nasopharyngeal swab specimens and should not be used as a sole basis for treatment. Nasal washings and aspirates are unacceptable for Xpert Xpress SARS-CoV-2/FLU/RSV testing.  Fact Sheet for Patients: SeriousBroker.it  Fact Sheet for Healthcare Providers: [place]  This test is not yet approved or cleared by the [employer]  FDA and has been authorized for detection and/or diagnosis of SARS-CoV-2 by FDA under an Emergency Use Authorization (EUA). This EUA will remain in effect (meaning this test can be used) for the duration of the COVID-19 declaration under Section 564(b)(1) of the Act, 21 U.S.C. section 360bbb-3(b)(1), unless the authorization is terminated or revoked.  Performed at [facility], [address], [place], [place] [address]   Urine Culture     Status: Abnormal   Collection Time: 03/18/21 10:15 PM   Specimen: Urine, Clean Catch  Result Value Ref Range Status   Specimen Description   Final    URINE, CLEAN CATCH Performed at [facility], [address] [place], [place] [address]    Special Requests   Final     NONE Performed at [facility], [address] [place], [place] [address]    Culture (A)  Final    >=100,000 COLONIES/mL ESCHERICHIA COLI Confirmed Extended Spectrum Beta-Lactamase Producer (ESBL).  In bloodstream infections from ESBL organisms, carbapenems are preferred over piperacillin/tazobactam. They are shown to have a lower risk of mortality.    Report Status 03/21/2021 FINAL  Final   Organism ID, Bacteria ESCHERICHIA COLI (A)  Final      Susceptibility   Escherichia coli - MIC*    AMPICILLIN >=32 RESISTANT Resistant     CEFAZOLIN >=64 RESISTANT Resistant     CEFEPIME 16 RESISTANT Resistant     CEFTRIAXONE >=64 RESISTANT Resistant     CIPROFLOXACIN 1 RESISTANT Resistant     GENTAMICIN <=1 SENSITIVE Sensitive     IMIPENEM <=0.25 SENSITIVE Sensitive     NITROFURANTOIN <=16 SENSITIVE Sensitive     TRIMETH/SULFA >=320 RESISTANT Resistant     AMPICILLIN/SULBACTAM 8 SENSITIVE Sensitive     PIP/TAZO <=4 SENSITIVE Sensitive     * >=100,000 COLONIES/mL ESCHERICHIA COLI      Radiology Studies: [place] EKG SITE RITE  Result Date: 03/21/2021 If Site Rite image not attached, placement could not be confirmed due to current cardiac rhythm.   Pamella Pert, MD, PhD [facility]  Between 7 am - 7 pm I am available, please contact me via Amion (for emergencies) or Securechat (non urgent messages)  Between 7 pm - 7 am I am not available, please contact night coverage MD/APP via Amion

## 2021-03-21 NOTE — Progress Notes (Addendum)
Pharmacy Antibiotic Note  Michelle Garner is a 55 y.o. female admitted on 03/18/2021 with  bilateral pyelonephritis .  Pharmacy has been consulted for meropenem dosing. Given this is pyelonephritis, other agents like Macrobid or Fosfomycin would not be indicated.   Plan: D/c ceftriaxone Start meropenem 1g IV q8 hours Monitor for clinical improvement, renal function, antibiotic LOT  Height: 5\' 6"  (167.6 cm) Weight: 95.7 kg (211 lb) IBW/kg (Calculated) : 59.3  Temp (24hrs), Avg:98.1 F (36.7 C), Min:97.8 F (36.6 C), Max:98.5 F (36.9 C)  Recent Labs  Lab 03/18/21 2150 03/19/21 2002 03/20/21 0358 03/21/21 0436  WBC 20.6* 17.0* 13.7* 11.7*  CREATININE 1.10* 0.92 0.82 0.71    Estimated Creatinine Clearance: 90.5 mL/min (by C-G formula based on SCr of 0.71 mg/dL).    No Known Allergies  Antimicrobials this admission: Ceftriaxone 10/27 >> 10/29 Meropenem 10/29 >>   Microbiology results: 10/26 UCx: ESBL Ecoli (S-Macrobid)  Thank you for allowing pharmacy to be a part of this patient's care.  11/26, PharmD 03/21/2021 10:28 AM

## 2021-03-21 NOTE — TOC Transition Note (Addendum)
Transition of Care [facility]) - CM/SW Discharge Note  Patient Details  Name: Michelle Garner MRN: [id number] Date of Birth: 05/14/1966  Transition of Care [facility]) CM/SW Contact:  Ewing Schlein, LCSW Phone Number: 03/21/2021, 1:51 PM  Clinical Narrative: Patient is expected to discharge tomorrow with IV antibiotics and HHRN. CSW made referral to [facility] with [facility]. [facility] to provide IV antibiotics; [facility] will provide [facility]. CSW updated patient and confirmed patient does not have a PCP as she moved to [place] this month.  Addendum: Patient's PCP is Dr. Shireen Quan in [place], [place].  Final next level of care: Home w Home Health Services Barriers to Discharge: Continued Medical Work up  Patient Goals and CMS Choice CMS Medicare.gov Compare Post Acute Care list provided to:: Patient Choice offered to / list presented to : Patient  Discharge Plan and Services         DME Arranged: N/A DME Agency: NA HH Arranged: RN HH Agency: [employer]or, mining, Other - See comment ([facility] to provide [facility])  Readmission Risk Interventions No flowsheet data found.

## 2021-03-22 DIAGNOSIS — E871 Hypo-osmolality and hyponatremia: Secondary | ICD-10-CM

## 2021-03-22 DIAGNOSIS — Z1612 Extended spectrum beta lactamase (ESBL) resistance: Secondary | ICD-10-CM

## 2021-03-22 DIAGNOSIS — E860 Dehydration: Secondary | ICD-10-CM | POA: Diagnosis not present

## 2021-03-22 DIAGNOSIS — E876 Hypokalemia: Secondary | ICD-10-CM

## 2021-03-22 DIAGNOSIS — A499 Bacterial infection, unspecified: Secondary | ICD-10-CM

## 2021-03-22 DIAGNOSIS — N12 Tubulo-interstitial nephritis, not specified as acute or chronic: Secondary | ICD-10-CM | POA: Diagnosis not present

## 2021-03-22 DIAGNOSIS — R112 Nausea with vomiting, unspecified: Secondary | ICD-10-CM

## 2021-03-22 LAB — URINE CULTURE: Culture: NO GROWTH

## 2021-03-22 MED ORDER — SODIUM CHLORIDE 0.9 % IV SOLN
1.0000 g | INTRAVENOUS | Status: DC
  Filled 2021-03-22: qty 1

## 2021-03-22 MED ORDER — SODIUM CHLORIDE 0.9 % IV SOLN
1.0000 g | INTRAVENOUS | Status: DC
  Administered 2021-03-22: 1000 mg via INTRAVENOUS
  Filled 2021-03-22: qty 1

## 2021-03-22 MED ORDER — NITROFURANTOIN MONOHYD MACRO 100 MG PO CAPS: 100.0000 mg | ORAL_CAPSULE | Freq: Two times a day (BID) | ORAL | 0 refills | Status: AC

## 2021-03-22 NOTE — Progress Notes (Signed)
Pt given discharge paperwork and discharge instructions. All questions answered with family member at bedside. IV removed. Pt left via wheelchair with staff.

## 2021-03-22 NOTE — Discharge Summary (Signed)
Physician Discharge Summary  Michelle Garner ATF:[id number] DOB: [birth year] DOA: 03/18/2021  PCP: Pcp, No  Admit date: 03/18/2021 Discharge date: 03/22/2021  Admitted From: home Disposition:  home  Recommendations for Outpatient Follow-up:  Follow up with PCP in 1-2 weeks  Home Health: none Equipment/Devices: none  Discharge Condition: stable CODE STATUS: Full code Diet recommendation: regular  HPI: Per admitting MD, Michelle Garner is a 55 y.o. female with medical history significant of HTN, hypothyroidism. Presenting with N/V and abdominal pain. She reports that she had UTI symptoms about a month ago. She decided to treat those symptoms at home with cranberry juice. She seemed to get better. However, about a week ago, she started having problems with N/V and abdominal pain. She describes a lower abdominal cramping. She has been very nauseous with these events and had a few episodes of vomiting. Her PO intake has been poor. She tried APAP but it didn't help. Her symptoms progressed through yesterday to include chills/sweats at home. She decided she needed to come to the ED for help. She denies any other aggravating or alleviating factors.     Hospital Course / Discharge diagnoses: Principal Problem Bilateral pyelonephritis / ESBL infection-she was initially placed on antibiotics ceftriaxone, clinically improving but still with some residual symptoms.  Urine cultures speciated ESBL E. coli and she was switched to Ertapenem.  ID was consulted, she received ertapenem for couple of days, clinically improved, and will be narrowed to [facility] for 3 additional days upon discharge   Active Problems Acute kidney injury-creatinine has now normalized with fluids.  CT abdomen and pelvis done on admission was without hydronephrosis  Hypokalemia-Repleted, improved Essential hypertension-resume home medications Hypothyroidism-resume home Synthroid Elevated LFTs- mild elevation,  hepatitis panel negative.  Stable  Sepsis ruled out   Discharge Instructions   Allergies as of 03/22/2021   No Known Allergies      Medication List     STOP taking these medications    acetaminophen 500 MG tablet Commonly known as: TYLENOL       TAKE these medications    levothyroxine 137 MCG tablet Commonly known as: SYNTHROID Take 137 mcg by mouth every morning.   lisinopril-hydrochlorothiazide 10-12.5 MG tablet Commonly known as: ZESTORETIC Take 1 tablet by mouth daily.   multivitamin with minerals Tabs tablet Take 1 tablet by mouth daily.   nitrofurantoin (macrocrystal-monohydrate) 100 MG capsule Commonly known as: Macrobid Take 1 capsule (100 mg total) by mouth 2 (two) times daily for 3 days. Start taking on: March 23, 2021        Follow-up Information     [facility] Follow up.   Why: IV antibiotics        [facility] Follow up.   Why: RN for IV antibiotics                Consultations: ID  Procedures/Studies:  CT ABDOMEN PELVIS W CONTRAST  Result Date: 03/18/2021 CLINICAL DATA:  Acute abdominal pain. EXAM: CT ABDOMEN AND PELVIS WITH CONTRAST TECHNIQUE: Multidetector CT imaging of the abdomen and pelvis was performed using the standard protocol following bolus administration of intravenous contrast. CONTRAST:  OMNIPAQUE IOHEXOL 300 MG/ML  SOLN COMPARISON:  None. FINDINGS: Lower chest: No acute abnormality. Hepatobiliary: No focal liver abnormality is seen. No gallstones, gallbladder wall thickening, or biliary dilatation. Pancreas: Unremarkable. No pancreatic ductal dilatation or surrounding inflammatory changes. Spleen: Normal in size without focal abnormality. Adrenals/Urinary Tract: On delayed imaging there is minimal patchy cortical hypodensity in both  kidneys. There is mild left perinephric fat stranding. There is mild bilateral ureteral enhancement. There is no hydronephrosis or perinephric fluid collection. The bladder is  within normal limits. Stomach/Bowel: Stomach is within normal limits. Appendix is not visualized. No evidence of bowel wall thickening, distention, or inflammatory changes. Vascular/Lymphatic: No significant vascular findings are present. No enlarged abdominal or pelvic lymph nodes. Reproductive: Uterus and bilateral adnexa are unremarkable. Other: No abdominal wall hernia or abnormality. No abdominopelvic ascites. Musculoskeletal: No acute or significant osseous findings. IMPRESSION: 1. Findings suggestive of bilateral pyelonephritis. No hydronephrosis. If there are no clinical signs for infection, infiltrating renal lesions are not excluded. Electronically Signed   By: Darliss Cheney M.D.   On: 03/18/2021 22:50   [place] EKG SITE RITE  Result Date: 03/21/2021 If Site Rite image not attached, placement could not be confirmed due to current cardiac rhythm.    Subjective: - no chest pain, shortness of breath, no abdominal pain, nausea or vomiting.   Discharge Exam: BP (!) 165/119 (BP Location: Left Arm)   Pulse 77   Temp 98.2 F (36.8 C) (Oral)   Resp 18   Ht 5\' 6"  (1.676 m)   Wt 95.7 kg   SpO2 100%   BMI 34.06 kg/m   General: Pt is alert, awake, not in acute distress Cardiovascular: RRR, S1/S2 +, no rubs, no gallops Respiratory: CTA bilaterally, no wheezing, no rhonchi Abdominal: Soft, NT, ND, bowel sounds + Extremities: no edema, no cyanosis    The results of significant diagnostics from this hospitalization (including imaging, microbiology, ancillary and laboratory) are listed below for reference.     Microbiology: Recent Results (from the past 240 hour(s))  Resp Panel by RT-PCR (Flu A&B, Covid) Nasopharyngeal Swab     Status: None   Collection Time: 03/18/21  9:50 PM   Specimen: Nasopharyngeal Swab; Nasopharyngeal(NP) swabs in vial transport medium  Result Value Ref Range Status   SARS Coronavirus 2 by RT PCR NEGATIVE NEGATIVE Final    Comment: (NOTE) SARS-CoV-2 target nucleic  acids are NOT DETECTED.  The SARS-CoV-2 RNA is generally detectable in upper respiratory specimens during the acute phase of infection. The lowest concentration of SARS-CoV-2 viral copies this assay can detect is 138 copies/mL. A negative result does not preclude SARS-Cov-2 infection and should not be used as the sole basis for treatment or other patient management decisions. A negative result may occur with  improper specimen collection/handling, submission of specimen other than nasopharyngeal swab, presence of viral mutation(s) within the areas targeted by this assay, and inadequate number of viral copies(<138 copies/mL). A negative result must be combined with clinical observations, patient history, and epidemiological information. The expected result is Negative.  Fact Sheet for Patients:  03/20/21  Fact Sheet for Healthcare Providers:  BloggerCourse.com  This test is no t yet approved or cleared by the [place] FDA and  has been authorized for detection and/or diagnosis of SARS-CoV-2 by FDA under an Emergency Use Authorization (EUA). This EUA will remain  in effect (meaning this test can be used) for the duration of the COVID-19 declaration under Section 564(b)(1) of the Act, 21 U.S.C.section 360bbb-3(b)(1), unless the authorization is terminated  or revoked sooner.       Influenza A by PCR NEGATIVE NEGATIVE Final   Influenza B by PCR NEGATIVE NEGATIVE Final    Comment: (NOTE) The Xpert Xpress SARS-CoV-2/FLU/RSV plus assay is intended as an aid in the diagnosis of influenza from Nasopharyngeal swab specimens and should not be used  as a sole basis for treatment. Nasal washings and aspirates are unacceptable for Xpert Xpress SARS-CoV-2/FLU/RSV testing.  Fact Sheet for Patients: BloggerCourse.com  Fact Sheet for Healthcare Providers: SeriousBroker.it  This  test is not yet approved or cleared by the [place] FDA and has been authorized for detection and/or diagnosis of SARS-CoV-2 by FDA under an Emergency Use Authorization (EUA). This EUA will remain in effect (meaning this test can be used) for the duration of the COVID-19 declaration under Section 564(b)(1) of the Act, 21 U.S.C. section 360bbb-3(b)(1), unless the authorization is terminated or revoked.  Performed at [facility], [address] [place], [place] [address]   Urine Culture     Status: Abnormal   Collection Time: 03/18/21 10:15 PM   Specimen: Urine, Clean Catch  Result Value Ref Range Status   Specimen Description   Final    URINE, CLEAN CATCH Performed at [facility], [address] [place], [place] [address]    Special Requests   Final    NONE Performed at [facility], [address] [place], [place] [address]    Culture (A)  Final    >=100,000 COLONIES/mL ESCHERICHIA COLI Confirmed Extended Spectrum Beta-Lactamase Producer (ESBL).  In bloodstream infections from ESBL organisms, carbapenems are preferred over piperacillin/tazobactam. They are shown to have a lower risk of mortality.    Report Status 03/21/2021 FINAL  Final   Organism ID, Bacteria ESCHERICHIA COLI (A)  Final      Susceptibility   Escherichia coli - MIC*    AMPICILLIN >=32 RESISTANT Resistant     CEFAZOLIN >=64 RESISTANT Resistant     CEFEPIME 16 RESISTANT Resistant     CEFTRIAXONE >=64 RESISTANT Resistant     CIPROFLOXACIN 1 RESISTANT Resistant     GENTAMICIN <=1 SENSITIVE Sensitive     IMIPENEM <=0.25 SENSITIVE Sensitive     NITROFURANTOIN <=16 SENSITIVE Sensitive     TRIMETH/SULFA >=320 RESISTANT Resistant     AMPICILLIN/SULBACTAM 8 SENSITIVE Sensitive     PIP/TAZO <=4 SENSITIVE Sensitive     * >=100,000 COLONIES/mL ESCHERICHIA COLI     Labs: Basic Metabolic Panel: Recent Labs  Lab 03/18/21 2150 03/19/21 2002 03/20/21 0358 03/21/21 0436  NA  129* 135 138 138  K 2.8* 3.8 3.6 3.9  CL 91* 103 103 102  CO2 22 23 25 25   GLUCOSE 112* 119* 94 92  BUN 9 7 7 8   CREATININE 1.10* 0.92 0.82 0.71  CALCIUM 9.1 8.6* 8.6* 9.1  MG 2.1 2.2  --   --    Liver Function Tests: Recent Labs  Lab 03/18/21 2150 03/19/21 2002 03/20/21 0358  AST 68* 55* 51*  ALT 56* 49* 47*  ALKPHOS 67 58 63  BILITOT 0.6 0.3 0.4  PROT 8.5* 7.7 7.6  ALBUMIN 3.8 3.4* 3.3*   CBC: Recent Labs  Lab 03/18/21 2150 03/19/21 2002 03/20/21 0358 03/21/21 0436  WBC 20.6* 17.0* 13.7* 11.7*  NEUTROABS 14.6* 12.1*  --   --   HGB 12.8 11.6* 11.9* 11.3*  HCT 38.5 36.8 37.8 35.4*  MCV 77.3* 80.2 80.8 81.0  PLT 369 353 318 355   CBG: No results for input(s): GLUCAP in the last 168 hours. Hgb A1c No results for input(s): HGBA1C in the last 72 hours. Lipid Profile No results for input(s): CHOL, HDL, LDLCALC, TRIG, CHOLHDL, LDLDIRECT in the last 72 hours. Thyroid function studies No results for input(s): TSH, T4TOTAL, T3FREE, THYROIDAB in the last  72 hours.  Invalid input(s): FREET3 Urinalysis    Component Value Date/Time   COLORURINE YELLOW 03/18/2021 2137   APPEARANCEUR CLOUDY (A) 03/18/2021 2137   LABSPEC 1.010 03/18/2021 2137   PHURINE 5.5 03/18/2021 2137   GLUCOSEU NEGATIVE 03/18/2021 2137   HGBUR MODERATE (A) 03/18/2021 2137   BILIRUBINUR NEGATIVE 03/18/2021 2137   KETONESUR 40 (A) 03/18/2021 2137   PROTEINUR 100 (A) 03/18/2021 2137   NITRITE POSITIVE (A) 03/18/2021 2137   LEUKOCYTESUR LARGE (A) 03/18/2021 2137    FURTHER DISCHARGE INSTRUCTIONS:   Get Medicines reviewed and adjusted: Please take all your medications with you for your next visit with your Primary MD   Laboratory/radiological data: Please request your Primary MD to go over all hospital tests and procedure/radiological results at the follow up, please ask your Primary MD to get all Hospital records sent to his/her office.   In some cases, they will be blood work, cultures and  biopsy results pending at the time of your discharge. Please request that your primary care M.D. goes through all the records of your hospital data and follows up on these results.   Also Note the following: If you experience worsening of your admission symptoms, develop shortness of breath, life threatening emergency, suicidal or homicidal thoughts you must seek medical attention immediately by calling 911 or calling your MD immediately  if symptoms less severe.   You must read complete instructions/literature along with all the possible adverse reactions/side effects for all the Medicines you take and that have been prescribed to you. Take any new Medicines after you have completely understood and accpet all the possible adverse reactions/side effects.    Do not drive when taking Pain medications or sleeping medications (Benzodaizepines)   Do not take more than prescribed Pain, Sleep and Anxiety Medications. It is not advisable to combine anxiety,sleep and pain medications without talking with your primary care practitioner   Special Instructions: If you have smoked or chewed Tobacco  in the last 2 yrs please stop smoking, stop any regular Alcohol  and or any Recreational drug use.   Wear Seat belts while driving.   Please note: You were cared for by a hospitalist during your hospital stay. Once you are discharged, your primary care physician will handle any further medical issues. Please note that NO REFILLS for any discharge medications will be authorized once you are discharged, as it is imperative that you return to your primary care physician (or establish a relationship with a primary care physician if you do not have one) for your post hospital discharge needs so that they can reassess your need for medications and monitor your lab values.  Time coordinating discharge: 35 minutes  SIGNED:  Pamella Pert, MD, PhD 03/22/2021, 2:18 PM

## 2021-03-22 NOTE — TOC Transition Note (Signed)
Transition of Care [facility]) - CM/SW Discharge Note   Patient Details  Name: Michelle Garner MRN: [id number] Date of Birth: [birth year]  [facility] CM/SW Contact:  Darleene Cleaver, LCSW Phone Number: 03/22/2021, 3:59 PM   Clinical Narrative:     CSW was informed that patient does not need to go home on IV antibiotics anymore.  Per infectious disease, patient will be going home on oral antibiotics.  CSW updated Pam at [employer].  CSW signing off, please reconsult if social work needs arise.   Final next level of care: Home/Self Care Barriers to Discharge: Barriers Resolved   Patient Goals and CMS Choice Patient states their goals for this hospitalization and ongoing recovery are:: To return back home on oral antibiotics.      Discharge Placement                       Discharge Plan and Services                            HH Agency:  ([facility] to provide [facility])        Social Determinants of Health (SDOH) Interventions     Readmission Risk Interventions No flowsheet data found.

## 2021-03-22 NOTE — Progress Notes (Signed)
Subjective:  She is bothered by her blood pressure being higher today and also very much would like to go home.     Antibiotics:  Anti-infectives (From admission, onward)    Start     Dose/Rate Route Frequency Ordered Stop   03/22/21 1230  ertapenem (INVANZ) 1,000 mg in sodium chloride 0.9 % 100 mL IVPB        1 g 200 mL/hr over 30 Minutes Intravenous Every 24 hours 03/22/21 1415     03/21/21 1900  ertapenem (INVANZ) 1,000 mg in sodium chloride 0.9 % 100 mL IVPB  Status:  Discontinued        1 g 200 mL/hr over 30 Minutes Intravenous Every 24 hours 03/21/21 1658 03/22/21 1415   03/21/21 1115  meropenem (MERREM) 1 g in sodium chloride 0.9 % 100 mL IVPB  Status:  Discontinued        1 g 200 mL/hr over 30 Minutes Intravenous Every 8 hours 03/21/21 1027 03/21/21 1658   03/19/21 2200  cefTRIAXone (ROCEPHIN) 2 g in sodium chloride 0.9 % 100 mL IVPB  Status:  Discontinued        2 g 200 mL/hr over 30 Minutes Intravenous Every 24 hours 03/19/21 0016 03/21/21 1013   03/18/21 2215  cefTRIAXone (ROCEPHIN) 2 g in sodium chloride 0.9 % 100 mL IVPB        2 g 200 mL/hr over 30 Minutes Intravenous  Once 03/18/21 2214 03/18/21 2343       Medications: Scheduled Meds:  enoxaparin (LOVENOX) injection  40 mg Subcutaneous Q24H   lisinopril  10 mg Oral Daily   And   hydrochlorothiazide  12.5 mg Oral Daily   levothyroxine  137 mcg Oral q morning   melatonin  3 mg Oral QHS   polyethylene glycol  17 g Oral Daily   Continuous Infusions:  ertapenem     PRN Meds:.acetaminophen **OR** acetaminophen, hydrALAZINE, morphine injection, ondansetron **OR** ondansetron (ZOFRAN) IV, oxyCODONE    Objective: Weight change:   Intake/Output Summary (Last 24 hours) at 03/22/2021 1415 Last data filed at 03/22/2021 1359 Gross per 24 hour  Intake 830 ml  Output --  Net 830 ml   Blood pressure (!) 165/119, pulse 77, temperature 98.2 F (36.8 C), temperature source Oral, resp. rate 18, height  5\' 6"  (1.676 m), weight 95.7 kg, SpO2 100 %. Temp:  [97.7 F (36.5 C)-98.2 F (36.8 C)] 98.2 F (36.8 C) (10/30 1402) Pulse Rate:  [62-77] 77 (10/30 1402) Resp:  [16-18] 18 (10/30 1402) BP: (135-181)/(80-119) 165/119 (10/30 1402) SpO2:  [100 %] 100 % (10/30 1402)  Physical Exam: Physical Exam Constitutional:      General: She is not in acute distress.    Appearance: She is well-developed. She is not diaphoretic.  HENT:     Head: Normocephalic and atraumatic.     Right Ear: External ear normal.     Left Ear: External ear normal.     Mouth/Throat:     Pharynx: No oropharyngeal exudate.  Eyes:     General: No scleral icterus.    Conjunctiva/sclera: Conjunctivae normal.     Pupils: Pupils are equal, round, and reactive to light.  Cardiovascular:     Rate and Rhythm: Normal rate and regular rhythm.  Pulmonary:     Effort: Pulmonary effort is normal. No respiratory distress.     Breath sounds: No wheezing.  Abdominal:     General: Bowel sounds are normal. There is no  distension.     Palpations: Abdomen is soft.  Musculoskeletal:        General: No tenderness. Normal range of motion.  Lymphadenopathy:     Cervical: No cervical adenopathy.  Skin:    General: Skin is warm and dry.     Coloration: Skin is not pale.     Findings: No erythema or rash.  Neurological:     General: No focal deficit present.     Mental Status: She is alert and oriented to person, place, and time.     Motor: No abnormal muscle tone.     Coordination: Coordination normal.  Psychiatric:        Mood and Affect: Mood normal.        Behavior: Behavior normal.        Thought Content: Thought content normal.        Judgment: Judgment normal.     CBC:    BMET Recent Labs    03/20/21 0358 03/21/21 0436  NA 138 138  K 3.6 3.9  CL 103 102  CO2 25 25  GLUCOSE 94 92  BUN 7 8  CREATININE 0.82 0.71  CALCIUM 8.6* 9.1     Liver Panel  Recent Labs    03/19/21 2002 03/20/21 0358  PROT 7.7  7.6  ALBUMIN 3.4* 3.3*  AST 55* 51*  ALT 49* 47*  ALKPHOS 58 63  BILITOT 0.3 0.4       Sedimentation Rate No results for input(s): ESRSEDRATE in the last 72 hours. C-Reactive Protein No results for input(s): CRP in the last 72 hours.  Micro Results: Recent Results (from the past 720 hour(s))  Resp Panel by RT-PCR (Flu A&B, Covid) Nasopharyngeal Swab     Status: None   Collection Time: 03/18/21  9:50 PM   Specimen: Nasopharyngeal Swab; Nasopharyngeal(NP) swabs in vial transport medium  Result Value Ref Range Status   SARS Coronavirus 2 by RT PCR NEGATIVE NEGATIVE Final    Comment: (NOTE) SARS-CoV-2 target nucleic acids are NOT DETECTED.  The SARS-CoV-2 RNA is generally detectable in upper respiratory specimens during the acute phase of infection. The lowest concentration of SARS-CoV-2 viral copies this assay can detect is 138 copies/mL. A negative result does not preclude SARS-Cov-2 infection and should not be used as the sole basis for treatment or other patient management decisions. A negative result may occur with  improper specimen collection/handling, submission of specimen other than nasopharyngeal swab, presence of viral mutation(s) within the areas targeted by this assay, and inadequate number of viral copies(<138 copies/mL). A negative result must be combined with clinical observations, patient history, and epidemiological information. The expected result is Negative.  Fact Sheet for Patients:  BloggerCourse.com  Fact Sheet for Healthcare Providers:  SeriousBroker.it  This test is no t yet approved or cleared by the [place] FDA and  has been authorized for detection and/or diagnosis of SARS-CoV-2 by FDA under an Emergency Use Authorization (EUA). This EUA will remain  in effect (meaning this test can be used) for the duration of the COVID-19 declaration under Section 564(b)(1) of the Act, 21 U.S.C.section  360bbb-3(b)(1), unless the authorization is terminated  or revoked sooner.       Influenza A by PCR NEGATIVE NEGATIVE Final   Influenza B by PCR NEGATIVE NEGATIVE Final    Comment: (NOTE) The Xpert Xpress SARS-CoV-2/FLU/RSV plus assay is intended as an aid in the diagnosis of influenza from Nasopharyngeal swab specimens and should not be used as a sole  basis for treatment. Nasal washings and aspirates are unacceptable for Xpert Xpress SARS-CoV-2/FLU/RSV testing.  Fact Sheet for Patients: BloggerCourse.com  Fact Sheet for Healthcare Providers: SeriousBroker.it  This test is not yet approved or cleared by the [place] FDA and has been authorized for detection and/or diagnosis of SARS-CoV-2 by FDA under an Emergency Use Authorization (EUA). This EUA will remain in effect (meaning this test can be used) for the duration of the COVID-19 declaration under Section 564(b)(1) of the Act, 21 U.S.C. section 360bbb-3(b)(1), unless the authorization is terminated or revoked.  Performed at [facility], [address] [place], [place] [address]   Urine Culture     Status: Abnormal   Collection Time: 03/18/21 10:15 PM   Specimen: Urine, Clean Catch  Result Value Ref Range Status   Specimen Description   Final    URINE, CLEAN CATCH Performed at [facility], [address] [place], [place] [address]    Special Requests   Final    NONE Performed at [facility], [address] [place], [place] [address]    Culture (A)  Final    >=100,000 COLONIES/mL ESCHERICHIA COLI Confirmed Extended Spectrum Beta-Lactamase Producer (ESBL).  In bloodstream infections from ESBL organisms, carbapenems are preferred over piperacillin/tazobactam. They are shown to have a lower risk of mortality.    Report Status 03/21/2021 FINAL  Final   Organism ID, Bacteria ESCHERICHIA COLI (A)  Final      Susceptibility    Escherichia coli - MIC*    AMPICILLIN >=32 RESISTANT Resistant     CEFAZOLIN >=64 RESISTANT Resistant     CEFEPIME 16 RESISTANT Resistant     CEFTRIAXONE >=64 RESISTANT Resistant     CIPROFLOXACIN 1 RESISTANT Resistant     GENTAMICIN <=1 SENSITIVE Sensitive     IMIPENEM <=0.25 SENSITIVE Sensitive     NITROFURANTOIN <=16 SENSITIVE Sensitive     TRIMETH/SULFA >=320 RESISTANT Resistant     AMPICILLIN/SULBACTAM 8 SENSITIVE Sensitive     PIP/TAZO <=4 SENSITIVE Sensitive     * >=100,000 COLONIES/mL ESCHERICHIA COLI    Studies/Results: [place] EKG SITE RITE  Result Date: 03/21/2021 If Site Rite image not attached, placement could not be confirmed due to current cardiac rhythm.     Assessment/Plan INTERVAL HISTORY: She received 1 dose of ertapenem yesterday.     Active Problems:   Pyelonephritis    Michelle Garner is a 55 y.o. female with history of dysuria and hematuria several weeks ago that improved with her taking cranberry juice more recently admitted with nausea vomiting anorexia that she associated with eating recouped soup that had rice and it.  When she was admitted the hospital she was in acute renal failure with hyponatremia hypokalemia and elevated white blood cell count.  There was concern for urinary tract infection with her pyuria though she did not have dysuria or flank pain or suprapubic tenderness.  CT of the abdomen pelvis done to work-up the nausea and vomiting raise question of bilateral pyelonephritis based on radiographic changes involving ureters and perinephric stranding.   That her cultures grew ESBL E. coli that was not sensitive to the ceftriaxone she had been receiving.  She seems to have completely resolved her symptoms and all of her labs also improved getting intravenous fluids and ceftriaxone.  There was still concern about the ESBL.  I do still think that either the ESBL is a contaminant of the urine, or potentially a colonizer or  a pathogen that  she cleared without effective antibiotics.  We gave her a dose of ertapenem yesterday and I am giving her another 1 today.  I think she could be safely discharged on Macrobid to complete an additional 3 days of therapy.  I do not think she has any clinical evidence of pyelonephritis at present.  I spent with the patient including face to face counseling of the patient  and her son re her clinical symptoms and above diagnoses personally CT abdomen, updated urine cultures, CBC BMP along with  review of medical records before and during the visit and in coordination of her care with Dr. Elvera Lennox.    LOS: 3 days   Acey Lav 03/22/2021, 2:15 PM

## 2021-03-22 NOTE — Consult Note (Signed)
Date of Admission:  03/18/2021          Reason for Consult: Possible ESBL UTI   Referring Provider: Pamella Pert, MD   Assessment:  ESBL bacteriuria with possible UTI Nausea with vomiting and anorexia  Dehydration Hyponatremia Hypokalemia Hypothyroidism   Plan:  Give dose of 1 g ertapenem today October 29 Agree with repeating urine cultures I do not think that she has clinical evidence of pyelonephritis and at most we may give her Macrobid or fosfomycin (should not need PICC)   Active Problems:   Pyelonephritis   Scheduled Meds:  enoxaparin (LOVENOX) injection  40 mg Subcutaneous Q24H   lisinopril  10 mg Oral Daily   And   hydrochlorothiazide  12.5 mg Oral Daily   levothyroxine  137 mcg Oral q morning   melatonin  3 mg Oral QHS   polyethylene glycol  17 g Oral Daily   Continuous Infusions:  ertapenem 1,000 mg (03/21/21 1957)   PRN Meds:.acetaminophen **OR** acetaminophen, hydrALAZINE, morphine injection, ondansetron **OR** ondansetron (ZOFRAN) IV, oxyCODONE  HPI: Michelle Garner is a 55 y.o. female with history of hypertension and hypothyroidism presented to the hospital with nausea vomiting abdominal pain and anorexia.  She states that a month prior to admission she had dysuria with hematuria abdominal pain and change in the color of her urine.  She said that she took more fluid in and drank cranberry juice with resolution of the symptoms.  However in the days prior to admission she had developed nausea with a few episodes of vomiting abdominal pain but without diarrhea.  She believes that she had "food poisoning".  She had eaten recently recooked soup soup that also had included rice in it.  She said that another friend who ate with her had become ill but she had had respiratory symptoms.  The patient was admitted to the hospital and found to be hypokalemic and hyponatremic and in acute renal failure.  She was afebrile but had a white count  of 20,600 with neutrophilic predominance.  He tested negative for COVID 19.  Her urinalysis showed pyuria with many bacteria.  She was placed on ceftriaxone and intravenous fluids with resolution of her symptoms and resolution of her leukocytosis with a white count coming down to 11,700.  She felt much better was already wanting to leave and go home on Friday.  In the interim her urine culture came back with an ESBL.  CT of the abdomen pelvis done on admission had shown some mild left perinephric fat stranding with mild bilateral ureteral enhancement.  Etiology suggested pyelonephritis.  Today on exam the patient is without any abdominal pain and without flank pain without dysuria without fevers and is comfortable.  She says that she was surprised when she was told that she had a urinary tract infection.  I suspected that she either did not have a urinary tract infection since she lacked specific symptoms associate with urinary tract upon admission and that she truly did have a different diagnosis such as possible gastroenteritis.  The other possibility is that she had a urinary tract infection and that the ESBL is a contaminant of the urine.  When asking her how the urine was collected it was not a extreme clean-catch specimen.  Finally another possibility is that she did have an urinary tract infection with an ESBL but that she has cleared this on her own without being on effective antibiotics.  I change her over to ertapenem today and  plan on seeing her again tomorrow.  I agree with repeating the urine culture to see if there is still are any ESBL left in the urine.  I do not think that she needs systemic antibiotics to treat her for pyelonephritis since she has no clinical evidence of pyelonephritis at present.  At most I would give her Macrobid or fosfomycin to go home with.  I will check in on her again tomorrow.  I spent 82 minutes with the patient including than 50% of the time in  face to face counseling of the patient the nature of urinary tract infections pyelonephritis gastroenteritis personally reviewing CT abdomen pelvis done on admission BMP CMP CBC with differential urine cultures along with review of medical records in preparation for the visit and during the visit and in coordination of her care.    Review of Systems: Review of Systems  Constitutional:  Negative for chills, fever, malaise/fatigue and weight loss.  HENT:  Negative for congestion and sore throat.   Eyes:  Negative for blurred vision and photophobia.  Respiratory:  Negative for cough, shortness of breath and wheezing.   Cardiovascular:  Negative for chest pain, palpitations and leg swelling.  Gastrointestinal:  Positive for nausea and vomiting. Negative for abdominal pain, blood in stool, constipation, diarrhea, heartburn and melena.  Genitourinary:  Negative for dysuria, flank pain and hematuria.  Musculoskeletal:  Negative for back pain, falls, joint pain and myalgias.  Skin:  Negative for itching and rash.  Neurological:  Negative for dizziness, focal weakness, loss of consciousness, weakness and headaches.  Endo/Heme/Allergies:  Does not bruise/bleed easily.  Psychiatric/Behavioral:  Negative for depression and suicidal ideas. The patient does not have insomnia.    Past Medical History:  Diagnosis Date   Hypertension    Hypothyroid    Thyroid disease     Social History   Tobacco Use   Smoking status: Never   Smokeless tobacco: Never  Vaping Use   Vaping Use: Never used  Substance Use Topics   Alcohol use: Never   Drug use: Never    No family history on file. No Known Allergies  OBJECTIVE: Blood pressure 135/83, pulse 62, temperature 97.9 F (36.6 C), temperature source Oral, resp. rate 16, height 5\' 6"  (1.676 m), weight 95.7 kg, SpO2 100 %.  Physical Exam Constitutional:      General: She is not in acute distress.    Appearance: Normal appearance. She is  well-developed. She is obese. She is not ill-appearing or diaphoretic.  HENT:     Head: Normocephalic and atraumatic.     Right Ear: Hearing and external ear normal.     Left Ear: Hearing and external ear normal.     Nose: No nasal deformity or rhinorrhea.  Eyes:     General: No scleral icterus.    Conjunctiva/sclera: Conjunctivae normal.     Right eye: Right conjunctiva is not injected.     Left eye: Left conjunctiva is not injected.     Pupils: Pupils are equal, round, and reactive to light.  Neck:     Vascular: No JVD.  Cardiovascular:     Rate and Rhythm: Normal rate and regular rhythm.     Heart sounds: Normal heart sounds, S1 normal and S2 normal. No murmur heard.   No friction rub.  Pulmonary:     Effort: No respiratory distress.     Breath sounds: No wheezing, rhonchi or rales.  Abdominal:     General: Bowel sounds are normal. There  is no distension.     Palpations: Abdomen is soft.     Tenderness: There is no abdominal tenderness.  Musculoskeletal:        General: Normal range of motion.     Right shoulder: Normal.     Left shoulder: Normal.     Cervical back: Normal range of motion and neck supple.     Right hip: Normal.     Left hip: Normal.     Right knee: Normal.     Left knee: Normal.  Lymphadenopathy:     Head:     Right side of head: No submandibular, preauricular or posterior auricular adenopathy.     Left side of head: No submandibular, preauricular or posterior auricular adenopathy.     Cervical: No cervical adenopathy.     Right cervical: No superficial or deep cervical adenopathy.    Left cervical: No superficial or deep cervical adenopathy.  Skin:    General: Skin is warm and dry.     Coloration: Skin is not pale.     Findings: No abrasion, bruising, ecchymosis, erythema, lesion or rash.     Nails: There is no clubbing.  Neurological:     Mental Status: She is alert and oriented to person, place, and time.     Sensory: No sensory deficit.      Coordination: Coordination normal.     Gait: Gait normal.  Psychiatric:        Attention and Perception: She is attentive.        Mood and Affect: Mood normal.        Speech: Speech normal.        Behavior: Behavior normal. Behavior is cooperative.        Thought Content: Thought content normal.        Judgment: Judgment normal.    Lab Results Lab Results  Component Value Date   WBC 11.7 (H) 03/21/2021   HGB 11.3 (L) 03/21/2021   HCT 35.4 (L) 03/21/2021   MCV 81.0 03/21/2021   PLT 355 03/21/2021    Lab Results  Component Value Date   CREATININE 0.71 03/21/2021   BUN 8 03/21/2021   NA 138 03/21/2021   K 3.9 03/21/2021   CL 102 03/21/2021   CO2 25 03/21/2021    Lab Results  Component Value Date   ALT 47 (H) 03/20/2021   AST 51 (H) 03/20/2021   ALKPHOS 63 03/20/2021   BILITOT 0.4 03/20/2021     Microbiology: Recent Results (from the past 240 hour(s))  Resp Panel by RT-PCR (Flu A&B, Covid) Nasopharyngeal Swab     Status: None   Collection Time: 03/18/21  9:50 PM   Specimen: Nasopharyngeal Swab; Nasopharyngeal(NP) swabs in vial transport medium  Result Value Ref Range Status   SARS Coronavirus 2 by RT PCR NEGATIVE NEGATIVE Final    Comment: (NOTE) SARS-CoV-2 target nucleic acids are NOT DETECTED.  The SARS-CoV-2 RNA is generally detectable in upper respiratory specimens during the acute phase of infection. The lowest concentration of SARS-CoV-2 viral copies this assay can detect is 138 copies/mL. A negative result does not preclude SARS-Cov-2 infection and should not be used as the sole basis for treatment or other patient management decisions. A negative result may occur with  improper specimen collection/handling, submission of specimen other than nasopharyngeal swab, presence of viral mutation(s) within the areas targeted by this assay, and inadequate number of viral copies(<138 copies/mL). A negative result must be combined with clinical observations, patient  history,  and epidemiological information. The expected result is Negative.  Fact Sheet for Patients:  BloggerCourse.com  Fact Sheet for Healthcare Providers:  SeriousBroker.it  This test is no t yet approved or cleared by the [place] FDA and  has been authorized for detection and/or diagnosis of SARS-CoV-2 by FDA under an Emergency Use Authorization (EUA). This EUA will remain  in effect (meaning this test can be used) for the duration of the COVID-19 declaration under Section 564(b)(1) of the Act, 21 U.S.C.section 360bbb-3(b)(1), unless the authorization is terminated  or revoked sooner.       Influenza A by PCR NEGATIVE NEGATIVE Final   Influenza B by PCR NEGATIVE NEGATIVE Final    Comment: (NOTE) The Xpert Xpress SARS-CoV-2/FLU/RSV plus assay is intended as an aid in the diagnosis of influenza from Nasopharyngeal swab specimens and should not be used as a sole basis for treatment. Nasal washings and aspirates are unacceptable for Xpert Xpress SARS-CoV-2/FLU/RSV testing.  Fact Sheet for Patients: BloggerCourse.com  Fact Sheet for Healthcare Providers: SeriousBroker.it  This test is not yet approved or cleared by the [place] FDA and has been authorized for detection and/or diagnosis of SARS-CoV-2 by FDA under an Emergency Use Authorization (EUA). This EUA will remain in effect (meaning this test can be used) for the duration of the COVID-19 declaration under Section 564(b)(1) of the Act, 21 U.S.C. section 360bbb-3(b)(1), unless the authorization is terminated or revoked.  Performed at [facility], [address] [place], [place] [address]   Urine Culture     Status: Abnormal   Collection Time: 03/18/21 10:15 PM   Specimen: Urine, Clean Catch  Result Value Ref Range Status   Specimen Description   Final    URINE, CLEAN CATCH Performed at [facility], [address] [place], [place] [address]    Special Requests   Final    NONE Performed at [facility], [address] [place], [place] [address]    Culture (A)  Final    >=100,000 COLONIES/mL ESCHERICHIA COLI Confirmed Extended Spectrum Beta-Lactamase Producer (ESBL).  In bloodstream infections from ESBL organisms, carbapenems are preferred over piperacillin/tazobactam. They are shown to have a lower risk of mortality.    Report Status 03/21/2021 FINAL  Final   Organism ID, Bacteria ESCHERICHIA COLI (A)  Final      Susceptibility   Escherichia coli - MIC*    AMPICILLIN >=32 RESISTANT Resistant     CEFAZOLIN >=64 RESISTANT Resistant     CEFEPIME 16 RESISTANT Resistant     CEFTRIAXONE >=64 RESISTANT Resistant     CIPROFLOXACIN 1 RESISTANT Resistant     GENTAMICIN <=1 SENSITIVE Sensitive     IMIPENEM <=0.25 SENSITIVE Sensitive     NITROFURANTOIN <=16 SENSITIVE Sensitive     TRIMETH/SULFA >=320 RESISTANT Resistant     AMPICILLIN/SULBACTAM 8 SENSITIVE Sensitive     PIP/TAZO <=4 SENSITIVE Sensitive     * >=100,000 COLONIES/mL ESCHERICHIA COLI    Acey Lav, MD [facility] [phone] pager  03/22/2021, 9:49 AM

## 2021-03-23 ENCOUNTER — Telehealth: Payer: Self-pay

## 2021-03-23 NOTE — Telephone Encounter (Signed)
Called pt per Initial Comment Caller notification Weldon Picking, RN Supervisor received and left detailed message stating I was returning her call to follow up on her request for appointment with a PCP (new patient appointment request).  I left my name and office phone number on her voice mail.

## 2021-03-25 ENCOUNTER — Encounter: Payer: Self-pay | Admitting: Nurse Practitioner

## 2021-03-25 ENCOUNTER — Other Ambulatory Visit: Payer: Self-pay

## 2021-03-25 ENCOUNTER — Ambulatory Visit: Payer: Managed Care, Other (non HMO) | Admitting: Nurse Practitioner

## 2021-03-25 VITALS — BP 136/86 | HR 94 | Temp 98.5°F | Resp 18

## 2021-03-25 DIAGNOSIS — Z7689 Persons encountering health services in other specified circumstances: Secondary | ICD-10-CM | POA: Diagnosis not present

## 2021-03-25 DIAGNOSIS — N12 Tubulo-interstitial nephritis, not specified as acute or chronic: Secondary | ICD-10-CM

## 2021-03-25 NOTE — Assessment & Plan Note (Signed)
Stay well hydrated  Continue current medications   Follow up:  Follow up for physical with Dr. Andrey Campanile and Amy

## 2021-03-25 NOTE — Progress Notes (Signed)
@Patient  ID: , female    DOB: 01-15-1966, 55 y.o.   MRN: [id number]  Chief Complaint  Patient presents with   Establish Care    Recent Hospital admission for pyelonephritis     Referring provider: No ref. provider found  HPI  Patient presents today to establish care.  She was hospitalized this past weekend for pyelonephritis.  She was treated with IV antibiotics and sent home on Macrobid.  She will finish this medication today.  She states that she is improving but does still feel fatigued.  Her last urine culture at discharge came back with no growth.  Overall labs were improved.  White blood cell count was trending down.  Patient would like to get set up for a physical as soon as possible.  Patient does not have a current PCP and does have a history of hypertension and thyroid disease.  She is compliant with current medications. Denies f/c/s, n/v/d, hemoptysis, PND, chest pain or edema.     No Known Allergies   There is no immunization history on file for this patient.  Past Medical History:  Diagnosis Date   Hypertension    Hypothyroid    Thyroid disease     Tobacco History: Social History   Tobacco Use  Smoking Status Never  Smokeless Tobacco Never   Counseling given: Yes   Outpatient Encounter Medications as of 03/25/2021  Medication Sig   levothyroxine (SYNTHROID) 137 MCG tablet Take 137 mcg by mouth every morning.   lisinopril-hydrochlorothiazide (ZESTORETIC) 10-12.5 MG tablet Take 1 tablet by mouth daily.   Multiple Vitamin (MULTIVITAMIN WITH MINERALS) TABS tablet Take 1 tablet by mouth daily.   nitrofurantoin, macrocrystal-monohydrate, (MACROBID) 100 MG capsule Take 1 capsule (100 mg total) by mouth 2 (two) times daily for 3 days.   No facility-administered encounter medications on file as of 03/25/2021.     Review of Systems  Review of Systems  Constitutional:  Positive for fatigue. Negative for fever.  HENT: Negative.     Cardiovascular: Negative.   Gastrointestinal: Negative.  Negative for abdominal distention and vomiting.  Allergic/Immunologic: Negative.   Neurological: Negative.   Psychiatric/Behavioral: Negative.        Physical Exam  BP 136/86   Pulse 94   Temp 98.5 F (36.9 C)   Resp 18   SpO2 97%   Wt Readings from Last 5 Encounters:  03/18/21 211 lb (95.7 kg)     Physical Exam Vitals and nursing note reviewed.  Constitutional:      General: She is not in acute distress.    Appearance: She is well-developed.  Cardiovascular:     Rate and Rhythm: Normal rate and regular rhythm.  Pulmonary:     Effort: Pulmonary effort is normal.     Breath sounds: Normal breath sounds.  Abdominal:     General: Bowel sounds are normal. There is no distension.  Neurological:     Mental Status: She is alert and oriented to person, place, and time.     Lab Results:  CBC    Component Value Date/Time   WBC 11.7 (H) 03/21/2021 0436   RBC 4.37 03/21/2021 0436   HGB 11.3 (L) 03/21/2021 0436   HCT 35.4 (L) 03/21/2021 0436   PLT 355 03/21/2021 0436   MCV 81.0 03/21/2021 0436   MCH 25.9 (L) 03/21/2021 0436   MCHC 31.9 03/21/2021 0436   RDW 14.6 03/21/2021 0436   LYMPHSABS 2.3 03/19/2021 2002   MONOABS 2.2 (H) 03/19/2021 2002  EOSABS 0.3 03/19/2021 2002   BASOSABS 0.1 03/19/2021 2002    BMET    Component Value Date/Time   NA 138 03/21/2021 0436   K 3.9 03/21/2021 0436   CL 102 03/21/2021 0436   CO2 25 03/21/2021 0436   GLUCOSE 92 03/21/2021 0436   BUN 8 03/21/2021 0436   CREATININE 0.71 03/21/2021 0436   CALCIUM 9.1 03/21/2021 0436   GFRNONAA >60 03/21/2021 0436    BNP No results found for: BNP  ProBNP No results found for: PROBNP  Imaging: CT ABDOMEN PELVIS W CONTRAST  Result Date: 03/18/2021 CLINICAL DATA:  Acute abdominal pain. EXAM: CT ABDOMEN AND PELVIS WITH CONTRAST TECHNIQUE: Multidetector CT imaging of the abdomen and pelvis was performed using the standard  protocol following bolus administration of intravenous contrast. CONTRAST:  OMNIPAQUE IOHEXOL 300 MG/ML  SOLN COMPARISON:  None. FINDINGS: Lower chest: No acute abnormality. Hepatobiliary: No focal liver abnormality is seen. No gallstones, gallbladder wall thickening, or biliary dilatation. Pancreas: Unremarkable. No pancreatic ductal dilatation or surrounding inflammatory changes. Spleen: Normal in size without focal abnormality. Adrenals/Urinary Tract: On delayed imaging there is minimal patchy cortical hypodensity in both kidneys. There is mild left perinephric fat stranding. There is mild bilateral ureteral enhancement. There is no hydronephrosis or perinephric fluid collection. The bladder is within normal limits. Stomach/Bowel: Stomach is within normal limits. Appendix is not visualized. No evidence of bowel wall thickening, distention, or inflammatory changes. Vascular/Lymphatic: No significant vascular findings are present. No enlarged abdominal or pelvic lymph nodes. Reproductive: Uterus and bilateral adnexa are unremarkable. Other: No abdominal wall hernia or abnormality. No abdominopelvic ascites. Musculoskeletal: No acute or significant osseous findings. IMPRESSION: 1. Findings suggestive of bilateral pyelonephritis. No hydronephrosis. If there are no clinical signs for infection, infiltrating renal lesions are not excluded. Electronically Signed   By: Darliss Cheney M.D.   On: 03/18/2021 22:50   [place] EKG SITE RITE  Result Date: 03/21/2021 If Site Rite image not attached, placement could not be confirmed due to current cardiac rhythm.    Assessment & Plan:   Pyelonephritis Stay well hydrated  Continue current medications   Follow up:  Follow up for physical with Dr. Andrey Campanile and Amy     Ivonne Andrew, NP 03/25/2021

## 2021-03-25 NOTE — Patient Instructions (Addendum)
Pyonephritis:  Stay well hydrated  Continue current medications   Follow up:  Follow up for physical with Dr. Andrey Campanile and Amy      Pyelonephritis, Adult Pyelonephritis is an infection that occurs in the kidney. The kidneys are organs that help clean the blood by moving waste out of the blood and into the pee (urine). This infection can happen quickly, or it can last for a long time. In most cases, it clears up with treatment and does not cause other problems. What are the causes? This condition may be caused by: Germs (bacteria) going from the bladder up to the kidney. This may happen after having a bladder infection. Germs going from the blood to the kidney. What increases the risk? This condition is more likely to develop in: Pregnant women. Older people. People who have any of these conditions: Diabetes. Inflammation of the prostate gland (prostatitis), in males. Kidney stones or bladder stones. Other problems with the kidney or the parts of your body that carry pee from the kidneys to the bladder (ureters). Cancer. People who have a small, thin tube (catheter) placed in the bladder. People who are sexually active. Women who use a medicine that kills sperm (spermicide) to prevent pregnancy. People who have had a prior urinary tract infection (UTI). What are the signs or symptoms? Symptoms of this condition include: Peeing often. A strong urge to pee right away. Burning or stinging when peeing. Belly pain. Back pain. Pain in the side (flank area). Fever or chills. Blood in the pee, or dark pee. Feeling sick to your stomach (nauseous) or throwing up (vomiting). How is this treated? This condition may be treated by: Taking antibiotic medicines by mouth (orally). Drinking enough fluids. If the infection is bad, you may need to stay in the hospital. You may be given antibiotics and fluids that are put directly into a vein through an IV tube. In some cases, other  treatments may be needed. Follow these instructions at home: Medicines Take your antibiotic medicine as told by your doctor. Do not stop taking the antibiotic even if you start to feel better. Take over-the-counter and prescription medicines only as told by your doctor. General instructions  Drink enough fluid to keep your pee pale yellow. Avoid caffeine, tea, and carbonated drinks. Pee (urinate) often. Avoid holding in pee for long periods of time. Pee before and after sex. After pooping (having a bowel movement), women should wipe from front to back. Use each tissue only once. Keep all follow-up visits as told by your doctor. This is important. Contact a doctor if: You do not feel better after 2 days. Your symptoms get worse. You have a fever. Get help right away if: You cannot take your medicine or drink fluids as told. You have chills and shaking. You throw up. You have very bad pain in your side or back. You feel very weak or you pass out (faint). Summary Pyelonephritis is an infection that occurs in the kidney. In most cases, this infection clears up with treatment and does not cause other problems. Take your antibiotic medicine as told by your doctor. Do not stop taking the antibiotic even if you start to feel better. Drink enough fluid to keep your pee pale yellow. This information is not intended to replace advice given to you by your health care provider. Make sure you discuss any questions you have with your health care provider. Document Revised: 03/14/2018 Document Reviewed: 03/14/2018 Elsevier Patient Education  2022 [employer].

## 2021-05-15 ENCOUNTER — Encounter: Payer: Managed Care, Other (non HMO) | Admitting: Family Medicine

## 2021-06-11 ENCOUNTER — Other Ambulatory Visit: Payer: Self-pay

## 2021-06-11 ENCOUNTER — Encounter: Payer: Self-pay | Admitting: Nurse Practitioner

## 2021-06-11 ENCOUNTER — Ambulatory Visit (INDEPENDENT_AMBULATORY_CARE_PROVIDER_SITE_OTHER): Payer: Self-pay | Admitting: Nurse Practitioner

## 2021-06-11 DIAGNOSIS — I10 Essential (primary) hypertension: Secondary | ICD-10-CM

## 2021-06-11 DIAGNOSIS — E039 Hypothyroidism, unspecified: Secondary | ICD-10-CM

## 2021-06-11 MED ORDER — LEVOTHYROXINE SODIUM 137 MCG PO TABS: 137.0000 ug | ORAL_TABLET | Freq: Every morning | ORAL | 2 refills | Status: DC

## 2021-06-11 MED ORDER — LISINOPRIL-HYDROCHLOROTHIAZIDE 10-12.5 MG PO TABS: 1.0000 | ORAL_TABLET | Freq: Every day | ORAL | 2 refills | Status: DC

## 2021-06-11 NOTE — Patient Instructions (Signed)
Medication refills:  Hypertension:  Will refill Zestoretic  Stay active  Continue to monitor BP at home  Heart healthy diet  Hypothyroid:  Will reorder synthroid  Follow up:  Follow up with Amy or Dr. Andrey Campanile for complete physical

## 2021-06-11 NOTE — Progress Notes (Signed)
Virtual Visit via Telephone Note  I connected with Michelle Garner on 06/11/21 at  8:00 AM EST by telephone and verified that I am speaking with the correct person using two identifiers.  Location: Patient: home Provider: office   I discussed the limitations, risks, security and privacy concerns of performing an evaluation and management service by telephone and the availability of in person appointments. I also discussed with the patient that there may be a patient responsible charge related to this service. The patient expressed understanding and agreed to proceed.   History of Present Illness:  Patient presents today for medication refills.  She needs her Synthroid and Zestoretic refilled today.  Patient does check blood pressures at home daily.  She states that generally her blood pressure does stay within normal range but she has been out of her medication for a little while now.  Patient is currently waiting on her insurance card so that she can schedule a physical. Denies f/c/s, n/v/d, hemoptysis, PND, chest pain or edema.      Observations/Objective:  Vitals with BMI 03/25/2021 03/22/2021 03/22/2021  Height - - -  Weight - - -  BMI - - -  Systolic 136 165 440  Diastolic 86 119 83  Pulse 94 77 62      Assessment and Plan:  Medication refills:  Hypertension:  Will refill Zestoretic  Stay active  Continue to monitor BP at home  Heart healthy diet  Hypothyroid:  Will reorder synthroid  Follow up:  Follow up with Amy or Dr. Andrey Campanile for complete physical     I discussed the assessment and treatment plan with the patient. The patient was provided an opportunity to ask questions and all were answered. The patient agreed with the plan and demonstrated an understanding of the instructions.   The patient was advised to call back or seek an in-person evaluation if the symptoms worsen or if the condition fails to improve as anticipated.  I provided 23 minutes  of non-face-to-face time during this encounter.   Ivonne Andrew, NP

## 2021-10-20 ENCOUNTER — Other Ambulatory Visit: Payer: Self-pay | Admitting: Nurse Practitioner

## 2021-10-20 NOTE — Telephone Encounter (Signed)
Medication Refill - Medication: levothyroxine (SYNTHROID) 137 MCG tablet (patient scheduled appointment for 11/11/2021) requesting a short supply   Has the patient contacted their pharmacy? Yes.    (Agent: If yes, when and what did the pharmacy advise?)Contact PCP and schedule appointment  Preferred Pharmacy (with phone number or street name):  [id number] [place], [place] - [address] Phone:  [phone]  Fax:  [phone]      Has the patient been seen for an appointment in the last year OR does the patient have an upcoming appointment? Yes.    Agent: Please be advised that RX refills may take up to 3 business days. We ask that you follow-up with your pharmacy.

## 2021-10-21 NOTE — Telephone Encounter (Signed)
Requested medications are due for refill today.  yes  Requested medications are on the active medications list.  yes  Last refill. 06/11/2021 #30 2 refills  Future visit scheduled.   yes  Notes to clinic.  No pcp listed. Missing labs. Rx written to expire 09/09/2021 - rx is expired. Rx signed by Angus Seller.    Requested Prescriptions  Pending Prescriptions Disp Refills   levothyroxine (SYNTHROID) 137 MCG tablet 30 tablet 2    Sig: Take 1 tablet (137 mcg total) by mouth every morning.     Endocrinology:  Hypothyroid Agents Failed - 10/20/2021  2:49 PM      Failed - TSH in normal range and within 360 days    No results found for: TSH, POCTSH, TSHREFLEX       Passed - Valid encounter within last 12 months    Recent Outpatient Visits           4 months ago Hypothyroidism, unspecified type   [facility], Gildardo Pounds, NP   7 months ago Encounter to establish care   Primary Care at [facility], Gildardo Pounds, NP       Future Appointments             In 3 weeks Rema Fendt, NP Primary [facility] at [facility]

## 2021-11-04 NOTE — Progress Notes (Signed)
Patient ID: Michelle Garner, female    DOB: March 09, 1966  MRN: [id number]  CC: Chronic Care Management  Subjective: Michelle Garner is a 56 y.o. female who presents for chronic care management.  Her concerns today include:  Last appointment 06/11/2021 with Angus Seller, NP. Zestoretic and Synthroid refilled at that time.   Today reports doing well on current regimen, no issues/concerns. Taking Zestoretic a few times weekly if and when she needs it. Reports she heard about side effects that blood pressure medication may cause. She does check her blood pressures at home. Taking Levothyroxine as prescribed. Requesting vitamin D screening and taking supplements over-the-counter.   Patient Active Problem List   Diagnosis Date Noted   Dehydration    Hypokalemia    Hyponatremia    Nausea and vomiting    ESBL (extended spectrum beta-lactamase) producing bacteria infection    Pyelonephritis 03/18/2021     Current Outpatient Medications on File Prior to Visit  Medication Sig Dispense Refill   levothyroxine (SYNTHROID) 137 MCG tablet Take 1 tablet (137 mcg total) by mouth every morning. 30 tablet 2   Multiple Vitamin (MULTIVITAMIN WITH MINERALS) TABS tablet Take 1 tablet by mouth daily.     No current facility-administered medications on file prior to visit.    No Known Allergies  Social History   Socioeconomic History   Marital status: Divorced    Spouse name: Not on file   Number of children: Not on file   Years of education: Not on file   Highest education level: Not on file  Occupational History   Not on file  Tobacco Use   Smoking status: Never    Passive exposure: Past   Smokeless tobacco: Never  Vaping Use   Vaping Use: Never used  Substance and Sexual Activity   Alcohol use: Never   Drug use: Never   Sexual activity: Not on file  Other Topics Concern   Not on file  Social History Narrative   Not on file   Social Determinants of Health   Financial Resource  Strain: Not on file  Food Insecurity: Not on file  Transportation Needs: Not on file  Physical Activity: Not on file  Stress: Not on file  Social Connections: Not on file  Intimate Partner Violence: Not on file    No family history on file.  Past Surgical History:  Procedure Laterality Date   CESAREAN SECTION      ROS: Review of Systems Negative except as stated above  PHYSICAL EXAM: BP 138/86 (BP Location: Left Arm, Patient Position: Sitting, Cuff Size: Large)   Pulse 65   Temp 98.3 F (36.8 C)   Resp 18   Ht 5\' 3"  (1.6 m)   Wt 218 lb (98.9 kg)   SpO2 98%   BMI 38.62 kg/m   Physical Exam HENT:     Head: Normocephalic and atraumatic.  Eyes:     Extraocular Movements: Extraocular movements intact.     Conjunctiva/sclera: Conjunctivae normal.     Pupils: Pupils are equal, round, and reactive to light.  Cardiovascular:     Rate and Rhythm: Normal rate and regular rhythm.     Pulses: Normal pulses.     Heart sounds: Normal heart sounds.  Pulmonary:     Effort: Pulmonary effort is normal.     Breath sounds: Normal breath sounds.  Musculoskeletal:     Cervical back: Normal range of motion and neck supple.  Neurological:     General: No  focal deficit present.     Mental Status: She is alert and oriented to person, place, and time.  Psychiatric:        Mood and Affect: Mood normal.        Behavior: Behavior normal.   ASSESSMENT AND PLAN: 1. Essential (primary) hypertension - Continue Lisinopril-Hydrochlorothiazide as prescribed.  - Counseled on blood pressure goal of less than 130/80, low-sodium, DASH diet, medication compliance, and 150 minutes of moderate intensity exercise per week as tolerated. Counseled on medication adherence and adverse effects. - Update BMP.  - Follow-up with primary provider in 4 months or sooner if needed.  - Basic Metabolic Panel - lisinopril-hydrochlorothiazide (ZESTORETIC) 10-12.5 MG tablet; Take 1 tablet by mouth daily.  Dispense:  30 tablet; Refill: 3  2. Hypothyroidism, unspecified type - Update TSH. Will update medication regimen pending results. Patient aware. - TSH  3. Vitamin D deficiency - Screening for deficiency.  - Vitamin D, 25-hydroxy    Patient was given the opportunity to ask questions.  Patient verbalized understanding of the plan and was able to repeat key elements of the plan. Patient was given clear instructions to go to Emergency Department or return to medical center if symptoms don't improve, worsen, or new problems develop.The patient verbalized understanding.   Orders Placed This Encounter  Procedures   Basic Metabolic Panel   TSH     Requested Prescriptions   Signed Prescriptions Disp Refills   lisinopril-hydrochlorothiazide (ZESTORETIC) 10-12.5 MG tablet 30 tablet 3    Sig: Take 1 tablet by mouth daily.    Return in about 4 months (around 03/13/2022) for Follow-Up or next available HTN .  Rema Fendt, NP

## 2021-11-11 ENCOUNTER — Encounter: Payer: Self-pay | Admitting: Family

## 2021-11-11 ENCOUNTER — Ambulatory Visit (INDEPENDENT_AMBULATORY_CARE_PROVIDER_SITE_OTHER): Payer: 59 | Admitting: Family

## 2021-11-11 VITALS — BP 138/86 | HR 65 | Temp 98.3°F | Resp 18 | Ht 63.0 in | Wt 218.0 lb

## 2021-11-11 DIAGNOSIS — E039 Hypothyroidism, unspecified: Secondary | ICD-10-CM | POA: Diagnosis not present

## 2021-11-11 DIAGNOSIS — I10 Essential (primary) hypertension: Secondary | ICD-10-CM

## 2021-11-11 DIAGNOSIS — E559 Vitamin D deficiency, unspecified: Secondary | ICD-10-CM

## 2021-11-11 MED ORDER — LISINOPRIL-HYDROCHLOROTHIAZIDE 10-12.5 MG PO TABS: 1.0000 | ORAL_TABLET | Freq: Every day | ORAL | 3 refills | Status: DC

## 2021-11-11 NOTE — Progress Notes (Signed)
.  Pt presents for chronic care management   

## 2021-11-11 NOTE — Patient Instructions (Signed)
Lisinopril; Hydrochlorothiazide Tablets What is this medication? LISINOPRIL; HYDROCHLOROTHIAZIDE (lyse IN oh pril; hye droe klor oh THYE a zide) treats high blood pressure. It relaxes your blood vessels and helps your kidneys remove more fluid through the urine, which lowers blood pressure. This medication is a combination of an ACE inhibitor and diuretic. This medicine may be used for other purposes; ask your health care provider or pharmacist if you have questions. COMMON BRAND NAME(S): Prinzide, Zestoretic What should I tell my care team before I take this medication? They need to know if you have any of these conditions: Bone marrow disease Decreased urine Diabetes Heart or blood vessel disease If you are on a special diet like a low salt diet Immune system problems, like lupus Kidney disease Liver disease Previous swelling of the tongue, face, or lips with difficulty breathing, difficulty swallowing, hoarseness, or tightening of the throat Recent heart attack or stroke An unusual or allergic reaction to lisinopril, hydrochlorothiazide, sulfa medications, other medications, insect venom, foods, dyes, or preservatives Pregnant or trying to get pregnant Breast-feeding How should I use this medication? Take this medication by mouth. Take it as directed on the prescription label at the same time every day. You can take it with or without food. If it upsets your stomach, take it with food. Keep taking it unless your care team tells you to stop. Talk to your care team about the use of this drug in children. Special care may be needed. Overdosage: If you think you have taken too much of this medicine contact a poison control center or emergency room at once. NOTE: This medicine is only for you. Do not share this medicine with others. What if I miss a dose? If you miss a dose, take it as soon as you can. If it is almost time for your next dose, take only that dose. Do not take double or extra  doses. What may interact with this medication? Do not take this medication with any of the following: Cidofovir Dofetilide Sacubitril; valsartan Tranylcypromine This medication may also interact with the following: Barbiturates like phenobarbital Blood pressure medications Celecoxib Corticosteroids like prednisone Diuretics, especially triamterene, spironolactone or amiloride Lithium Medications for diabetes Medications that relax the muscles for surgery Narcotic medications for pain NSAIDs, medications for pain and inflammation, like ibuprofen or naproxen Potassium salts or potassium supplements Some cholesterol lowering medications like cholestyramine or colestipol This list may not describe all possible interactions. Give your health care provider a list of all the medicines, herbs, non-prescription drugs, or dietary supplements you use. Also tell them if you smoke, drink alcohol, or use illegal drugs. Some items may interact with your medicine. What should I watch for while using this medication? Visit your care team for regular checks on your progress. Check your blood pressure as directed. Ask your care team what your blood pressure should be and when you should contact him or her. Call your care team if you notice an irregular or fast heartbeat. You must not get dehydrated. Ask your care team how much fluid you need to drink a day. Check with them if you get an attack of severe diarrhea, nausea and vomiting, or if you sweat a lot. The loss of too much body fluid can make it dangerous for you to take this medication. Women should inform their care team if they wish to become pregnant or think they might be pregnant. There is a potential for serious side effects to an unborn child. Talk to your  care team or pharmacist for more information. You may get drowsy or dizzy. Do not drive, use machinery, or do anything that needs mental alertness until you know how this medication affects you.  Do not stand or sit up quickly, especially if you are an older patient. This reduces the risk of dizzy or fainting spells. Alcohol can make you more drowsy and dizzy. Avoid alcoholic drinks. This medication may increase blood sugar. Ask your care team if changes in diet or medications are needed if you have diabetes. Avoid salt substitutes unless you are told otherwise by your care team. Talk to your care team about your risk of skin cancer. You may be more at risk for skin cancer if you take this medication. This medication can make you more sensitive to the sun. Keep out of the sun. If you cannot avoid being in the sun, wear protective clothing and use sunscreen. Do not use sun lamps or tanning beds/booths. Do not treat yourself for coughs, colds, or pain while you are taking this medication without asking your care team for advice. Some ingredients may increase your blood pressure. What side effects may I notice from receiving this medication? Side effects that you should report to your care team as soon as possible: Allergic reactions or angioedema--skin rash, itching or hives, swelling of the face eyes, lips, tongue, arms, or legs, trouble swallowing or breathing Dehydration--increased thirst, dry mouth, feeling faint or lightheaded, headache, dark yellow or brown urine Gout--severe pain, redness, warmth, or swelling in joints, such as the big toe Kidney injury--decrease in the amount of urine, swelling of the ankles, hands, or feet Liver injury--right upper belly pain, loss of appetite, nausea, light-colored stool, dark yellow or brown urine, yellowing skin or eyes, unusual weakness or fatigue Low blood pressure--dizziness, feeling faint or lightheaded, blurry vision Low potassium level--muscle pain or cramps, unusual weakness, fatigue, fast or irregular heartbeat, constipation Sudden eye pain or change in vision such as blurred vision, seeing halos around lights, vision loss Side effects that  usually do not require medical attention (report to your care team if they continue or are bothersome): Change in sex drive or performance Cough Dizziness Headache Upset stomach This list may not describe all possible side effects. Call your doctor for medical advice about side effects. You may report side effects to FDA at 1-800-FDA-1088. Where should I keep my medication? Keep out of the reach of children and pets. Store at room temperature between 20 and 25 degrees C (68 and 77 degrees F). Protect from light and moisture. Keep the container tightly closed. Throw away any unused medication after the expiration date. NOTE: This sheet is a summary. It may not cover all possible information. If you have questions about this medicine, talk to your doctor, pharmacist, or health care provider.  2023 Elsevier/Gold Standard (2021-04-10 00:00:00)

## 2021-11-12 ENCOUNTER — Other Ambulatory Visit: Payer: Self-pay | Admitting: Family

## 2021-11-12 DIAGNOSIS — E039 Hypothyroidism, unspecified: Secondary | ICD-10-CM

## 2021-11-12 LAB — TSH: TSH: 6.79 u[IU]/mL — ABNORMAL HIGH (ref 0.450–4.500)

## 2021-11-12 LAB — BASIC METABOLIC PANEL
BUN/Creatinine Ratio: 16 (ref 9–23)
BUN: 15 mg/dL (ref 6–24)
CO2: 25 mmol/L (ref 20–29)
Calcium: 9.7 mg/dL (ref 8.7–10.2)
Chloride: 103 mmol/L (ref 96–106)
Creatinine, Ser: 0.91 mg/dL (ref 0.57–1.00)
Glucose: 75 mg/dL (ref 70–99)
Potassium: 4 mmol/L (ref 3.5–5.2)
Sodium: 142 mmol/L (ref 134–144)
eGFR: 74 mL/min/{1.73_m2} (ref >59)

## 2021-11-12 LAB — VITAMIN D 25 HYDROXY (VIT D DEFICIENCY, FRACTURES): Vit D, 25-Hydroxy: 46.5 ng/mL (ref 30.0–100.0)

## 2021-11-12 MED ORDER — LEVOTHYROXINE SODIUM 150 MCG PO TABS: 137.0000 ug | ORAL_TABLET | Freq: Every morning | ORAL | 1 refills | Status: DC

## 2022-01-15 ENCOUNTER — Other Ambulatory Visit: Payer: Self-pay | Admitting: Family

## 2022-01-15 DIAGNOSIS — E039 Hypothyroidism, unspecified: Secondary | ICD-10-CM

## 2022-02-22 NOTE — Progress Notes (Deleted)
Patient ID: Michelle Garner, female    DOB: Nov 28, 1965  MRN: [id number]  CC: Annual Physical Exam  Subjective: Michelle Garner is a 56 y.o. female who presents for annual physical exam.   Her concerns today include:  HTN - Lisinopril-HCTZ Hypothyroid - was increased to 150 mcg  Mammo  PAP Colon ca  Patient Active Problem List   Diagnosis Date Noted   Dehydration    Hypokalemia    Hyponatremia    Nausea and vomiting    ESBL (extended spectrum beta-lactamase) producing bacteria infection    Pyelonephritis 03/18/2021     Current Outpatient Medications on File Prior to Visit  Medication Sig Dispense Refill   levothyroxine (SYNTHROID) 150 MCG tablet TAKE 1 TABLET BY MOUTH EVERY MORNING 30 tablet 1   lisinopril-hydrochlorothiazide (ZESTORETIC) 10-12.5 MG tablet Take 1 tablet by mouth daily. 30 tablet 3   Multiple Vitamin (MULTIVITAMIN WITH MINERALS) TABS tablet Take 1 tablet by mouth daily.     No current facility-administered medications on file prior to visit.    No Known Allergies  Social History   Socioeconomic History   Marital status: Divorced    Spouse name: Not on file   Number of children: Not on file   Years of education: Not on file   Highest education level: Not on file  Occupational History   Not on file  Tobacco Use   Smoking status: Never    Passive exposure: Past   Smokeless tobacco: Never  Vaping Use   Vaping Use: Never used  Substance and Sexual Activity   Alcohol use: Never   Drug use: Never   Sexual activity: Not on file  Other Topics Concern   Not on file  Social History Narrative   Not on file   Social Determinants of Health   Financial Resource Strain: Not on file  Food Insecurity: Not on file  Transportation Needs: Not on file  Physical Activity: Not on file  Stress: Not on file  Social Connections: Not on file  Intimate Partner Violence: Not on file    No family history on file.  Past Surgical History:  Procedure  Laterality Date   CESAREAN SECTION      ROS: Review of Systems Negative except as stated above  PHYSICAL EXAM: There were no vitals taken for this visit.  Physical Exam  {female adult master:310786} {female adult master:310785}     Latest Ref Rng & Units 11/11/2021    3:50 PM 03/21/2021    4:36 AM 03/20/2021    3:58 AM  CMP  Glucose 70 - 99 mg/dL 75  92  94   BUN 6 - 24 mg/dL 15  8  7    Creatinine 0.57 - 1.00 mg/dL 0.91  0.71  0.82   Sodium 134 - 144 mmol/L 142  138  138   Potassium 3.5 - 5.2 mmol/L 4.0  3.9  3.6   Chloride 96 - 106 mmol/L 103  102  103   CO2 20 - 29 mmol/L 25  25  25    Calcium 8.7 - 10.2 mg/dL 9.7  9.1  8.6   Total Protein 6.5 - 8.1 g/dL   7.6   Total Bilirubin 0.3 - 1.2 mg/dL   0.4   Alkaline Phos 38 - 126 U/L   63   AST 15 - 41 U/L   51   ALT 0 - 44 U/L   47    Lipid Panel  No results found for: "CHOL", "TRIG", "  HDL", "CHOLHDL", "VLDL", "LDLCALC", "LDLDIRECT"  CBC    Component Value Date/Time   WBC 11.7 (H) 03/21/2021 0436   RBC 4.37 03/21/2021 0436   HGB 11.3 (L) 03/21/2021 0436   HCT 35.4 (L) 03/21/2021 0436   PLT 355 03/21/2021 0436   MCV 81.0 03/21/2021 0436   MCH 25.9 (L) 03/21/2021 0436   MCHC 31.9 03/21/2021 0436   RDW 14.6 03/21/2021 0436   LYMPHSABS 2.3 03/19/2021 2002   MONOABS 2.2 (H) 03/19/2021 2002   EOSABS 0.3 03/19/2021 2002   BASOSABS 0.1 03/19/2021 2002    ASSESSMENT AND PLAN:  There are no diagnoses linked to this encounter.   Patient was given the opportunity to ask questions.  Patient verbalized understanding of the plan and was able to repeat key elements of the plan. Patient was given clear instructions to go to Emergency Department or return to medical center if symptoms don't improve, worsen, or new problems develop.The patient verbalized understanding.   No orders of the defined types were placed in this encounter.    Requested Prescriptions    No prescriptions requested or ordered in this encounter     No follow-ups on file.  Rema Fendt, NP

## 2022-03-01 ENCOUNTER — Encounter: Payer: 59 | Admitting: Family

## 2022-03-01 DIAGNOSIS — Z130 Encounter for screening for diseases of the blood and blood-forming organs and certain disorders involving the immune mechanism: Secondary | ICD-10-CM

## 2022-03-01 DIAGNOSIS — I10 Essential (primary) hypertension: Secondary | ICD-10-CM

## 2022-03-01 DIAGNOSIS — E039 Hypothyroidism, unspecified: Secondary | ICD-10-CM

## 2022-03-01 DIAGNOSIS — Z0000 Encounter for general adult medical examination without abnormal findings: Secondary | ICD-10-CM

## 2022-03-01 DIAGNOSIS — Z113 Encounter for screening for infections with a predominantly sexual mode of transmission: Secondary | ICD-10-CM

## 2022-03-01 DIAGNOSIS — Z131 Encounter for screening for diabetes mellitus: Secondary | ICD-10-CM

## 2022-03-01 DIAGNOSIS — Z1231 Encounter for screening mammogram for malignant neoplasm of breast: Secondary | ICD-10-CM

## 2022-03-01 DIAGNOSIS — Z1211 Encounter for screening for malignant neoplasm of colon: Secondary | ICD-10-CM

## 2022-03-01 DIAGNOSIS — Z124 Encounter for screening for malignant neoplasm of cervix: Secondary | ICD-10-CM

## 2022-03-01 DIAGNOSIS — Z13220 Encounter for screening for lipoid disorders: Secondary | ICD-10-CM

## 2022-03-01 DIAGNOSIS — Z13228 Encounter for screening for other metabolic disorders: Secondary | ICD-10-CM

## 2022-03-19 ENCOUNTER — Other Ambulatory Visit: Payer: Self-pay | Admitting: Family

## 2022-03-19 DIAGNOSIS — E039 Hypothyroidism, unspecified: Secondary | ICD-10-CM

## 2022-03-19 NOTE — Telephone Encounter (Signed)
At 11/11/21 visit provided advised that TSH would need to be updated for further refills, pt cancelled 03/01/22 appt.unable to refill Levothyroxine until labs are completed

## 2022-03-23 ENCOUNTER — Other Ambulatory Visit: Payer: Self-pay | Admitting: Family

## 2022-03-23 DIAGNOSIS — E039 Hypothyroidism, unspecified: Secondary | ICD-10-CM

## 2022-04-11 ENCOUNTER — Other Ambulatory Visit: Payer: Self-pay | Admitting: Family

## 2022-04-11 DIAGNOSIS — E039 Hypothyroidism, unspecified: Secondary | ICD-10-CM

## 2022-04-13 ENCOUNTER — Ambulatory Visit: Payer: Self-pay

## 2022-04-13 NOTE — Telephone Encounter (Signed)
Message from [place] sent at 04/13/2022 10:55 AM EST  Summary: body aches, has a cough, and had a fever   Pt stated yesterday she started to feel body aches, has a cough, and had a fever (did not check her temperature). She stated she feels tired. Pt mentioned would like to be tested for flu; she stated she tested negative for COVID.   No appointments; please advise.        Called pt and LM on VM to call back to discuss sx. Erin Mecum has appts today.

## 2022-04-13 NOTE — Telephone Encounter (Signed)
  Chief Complaint: Cough Symptoms: Dry cough, "Worse yesterday, better."  Body aches, subjective fever yesterday "Not today" Covid neg. Frequency: Yesterday Pertinent Negatives: Patient denies SOB Disposition: [] ED /[] Urgent Care (no appt availability in office) / [] Appointment(In office/virtual)/ []  [facility]/ [x] Home Care/ [] Refused Recommended Disposition /[] [facility]/ []  Follow-up with PCP Additional Notes: Pt states workplace asked her to get tested for flu. States she called UC, "Too expensive." States "Feel some better from yesterday, I know how to treat this if it is the flu. I'm not getting tested." Home care advise provided, pt verbalizes understanding. Advised to [facility] for worsening symptoms. Reason for Disposition  Cough with cold symptoms (e.g., runny nose, postnasal drip, throat clearing)  Answer Assessment - Initial Assessment Questions 1. ONSET: "When did the cough begin?"      Yesterday 2. SEVERITY: "How bad is the cough today?"      Moderate 3. SPUTUM: "Describe the color of your sputum" (none, dry cough; clear, white, yellow, green)     Dry 4. HEMOPTYSIS: "Are you coughing up any blood?" If so ask: "How much?" (flecks, streaks, tablespoons, etc.)     no 5. DIFFICULTY BREATHING: "Are you having difficulty breathing?" If Yes, ask: "How bad is it?" (e.g., mild, moderate, severe)    - MILD: No SOB at rest, mild SOB with walking, speaks normally in sentences, can lie down, no retractions, pulse < 100.    - MODERATE: SOB at rest, SOB with minimal exertion and prefers to sit, cannot lie down flat, speaks in phrases, mild retractions, audible wheezing, pulse 100-120.    - SEVERE: Very SOB at rest, speaks in single words, struggling to breathe, sitting hunched forward, retractions, pulse > 120      no 6. FEVER: "Do you have a fever?" If Yes, ask: "What is your temperature, how was it measured, and when did it start?"     Last night 10. OTHER SYMPTOMS: "Do  you have any other symptoms?" (e.g., runny nose, wheezing, chest pain)       Body aches  Protocols used: Cough - Acute Non-Productive-A-AH

## 2022-04-20 NOTE — Progress Notes (Signed)
Patient ID: Michelle Garner, female    DOB: 04/24/1966  MRN: [id number]  CC: Chronic Care Management   Subjective: Michelle Garner is a 56 y.o. female who presents for chronic care management.   Her concerns today include:  - Doing well on blood pressure medication, no issues/concerns. Taking blood pressure medication as needed and the last time being yesterday. States taking blood pressure medication as needed due to reservations about side effects.  - Doing well on thyroid medication, no issues/concerns.  - Tried over-the-counter sleep aids without improvement. Ready to try prescribed sleep aid.  - No further issues/concerns.   Patient Active Problem List   Diagnosis Date Noted   Dehydration    Hypokalemia    Nausea and vomiting    ESBL (extended spectrum beta-lactamase) producing bacteria infection    Pyelonephritis 03/18/2021   Class 2 obesity due to excess calories in adult 08/08/2019   Low vitamin D level 02/06/2014   Acquired hypothyroidism 02/06/2014   Benign essential HTN 02/06/2014   Hyperlipidemia 02/06/2014     Current Outpatient Medications on File Prior to Visit  Medication Sig Dispense Refill   levothyroxine (SYNTHROID) 150 MCG tablet TAKE 1 TABLET BY MOUTH EVERY MORNING 30 tablet 1   Multiple Vitamin (MULTIVITAMIN WITH MINERALS) TABS tablet Take 1 tablet by mouth daily.     No current facility-administered medications on file prior to visit.    No Known Allergies  Social History   Socioeconomic History   Marital status: Divorced    Spouse name: Not on file   Number of children: Not on file   Years of education: Not on file   Highest education level: Not on file  Occupational History   Not on file  Tobacco Use   Smoking status: Never    Passive exposure: Past   Smokeless tobacco: Never  Vaping Use   Vaping Use: Never used  Substance and Sexual Activity   Alcohol use: Never   Drug use: Never   Sexual activity: Not on file  Other Topics  Concern   Not on file  Social History Narrative   Not on file   Social Determinants of Health   Financial Resource Strain: Not on file  Food Insecurity: Not on file  Transportation Needs: Not on file  Physical Activity: Not on file  Stress: Not on file  Social Connections: Not on file  Intimate Partner Violence: Not on file    No family history on file.  Past Surgical History:  Procedure Laterality Date   CESAREAN SECTION      ROS: Review of Systems Negative except as stated above  PHYSICAL EXAM: BP 136/83 (BP Location: Left Arm, Patient Position: Sitting, Cuff Size: Large)   Pulse 90   Temp 98.3 F (36.8 C)   Resp 16   Ht 5' 2.99" (1.6 m)   Wt 217 lb (98.4 kg)   SpO2 96%   BMI 38.45 kg/m   Physical Exam HENT:     Head: Normocephalic and atraumatic.  Eyes:     Extraocular Movements: Extraocular movements intact.     Conjunctiva/sclera: Conjunctivae normal.     Pupils: Pupils are equal, round, and reactive to light.  Cardiovascular:     Rate and Rhythm: Normal rate and regular rhythm.     Pulses: Normal pulses.     Heart sounds: Normal heart sounds.  Pulmonary:     Effort: Pulmonary effort is normal.     Breath sounds: Normal breath sounds.  Musculoskeletal:     Cervical back: Normal range of motion and neck supple.  Neurological:     General: No focal deficit present.     Mental Status: She is alert and oriented to person, place, and time.  Psychiatric:        Mood and Affect: Mood normal.        Behavior: Behavior normal.     ASSESSMENT AND PLAN: 1. Primary hypertension - Lisinopril-Hydrochlorothiazide as prescribed.  - Counseled on blood pressure goal of less than 130/80, low-sodium, DASH diet, medication compliance, and 150 minutes of moderate intensity exercise per week as tolerated. Counseled on medication adherence and adverse effects. - Update BMP.  - Follow-up with primary provider in 3 months or sooner if needed.  - Basic Metabolic  Panel - lisinopril-hydrochlorothiazide (ZESTORETIC) 10-12.5 MG tablet; Take 1 tablet by mouth daily.  Dispense: 30 tablet; Refill: 2  2. Hypothyroidism, unspecified type - Update TSH. Will update Levothyroxine regimen once lab results.  - Follow-up with primary provider as scheduled.  - TSH  3. Diabetes mellitus screening - Routine screening.  - Hemoglobin A1c  4. Insomnia, unspecified type - Trazodone as prescribed. Counseled on medication adherence/adverse effects.  - Follow-up with primary provider in 4 weeks or sooner if needed.  - traZODone (DESYREL) 50 MG tablet; Take 1 tablet (50 mg total) by mouth at bedtime.  Dispense: 30 tablet; Refill: 0  Patient was given the opportunity to ask questions.  Patient verbalized understanding of the plan and was able to repeat key elements of the plan. Patient was given clear instructions to go to Emergency Department or return to medical center if symptoms don't improve, worsen, or new problems develop.The patient verbalized understanding.   Orders Placed This Encounter  Procedures   Basic Metabolic Panel   TSH   Hemoglobin A1c    Requested Prescriptions   Signed Prescriptions Disp Refills   lisinopril-hydrochlorothiazide (ZESTORETIC) 10-12.5 MG tablet 30 tablet 2    Sig: Take 1 tablet by mouth daily.   traZODone (DESYREL) 50 MG tablet 30 tablet 0    Sig: Take 1 tablet (50 mg total) by mouth at bedtime.    Return in about 3 months (around 07/23/2022) for Follow-Up or next available chronic care mgmt .  Rema Fendt, NP

## 2022-04-23 ENCOUNTER — Ambulatory Visit: Payer: Managed Care, Other (non HMO) | Admitting: Family

## 2022-04-23 ENCOUNTER — Encounter: Payer: Self-pay | Admitting: Family

## 2022-04-23 VITALS — BP 136/83 | HR 90 | Temp 98.3°F | Resp 16 | Ht 62.99 in | Wt 217.0 lb

## 2022-04-23 DIAGNOSIS — Z131 Encounter for screening for diabetes mellitus: Secondary | ICD-10-CM

## 2022-04-23 DIAGNOSIS — G4700 Insomnia, unspecified: Secondary | ICD-10-CM | POA: Diagnosis not present

## 2022-04-23 DIAGNOSIS — E039 Hypothyroidism, unspecified: Secondary | ICD-10-CM | POA: Diagnosis not present

## 2022-04-23 DIAGNOSIS — I10 Essential (primary) hypertension: Secondary | ICD-10-CM | POA: Diagnosis not present

## 2022-04-23 MED ORDER — TRAZODONE HCL 50 MG PO TABS: 50.0000 mg | ORAL_TABLET | Freq: Every day | ORAL | 0 refills | Status: DC

## 2022-04-23 MED ORDER — LISINOPRIL-HYDROCHLOROTHIAZIDE 10-12.5 MG PO TABS: 1.0000 | ORAL_TABLET | Freq: Every day | ORAL | 2 refills | Status: DC

## 2022-04-23 NOTE — Progress Notes (Signed)
.  Pt presents for chronic care management   -refill need on levothyroxine and Lisinopril-HCTZ

## 2022-04-23 NOTE — Patient Instructions (Signed)

## 2022-04-24 ENCOUNTER — Other Ambulatory Visit: Payer: Self-pay | Admitting: Family

## 2022-04-24 DIAGNOSIS — R7303 Prediabetes: Secondary | ICD-10-CM

## 2022-04-24 DIAGNOSIS — E039 Hypothyroidism, unspecified: Secondary | ICD-10-CM

## 2022-04-24 LAB — TSH: TSH: 2.79 u[IU]/mL (ref 0.450–4.500)

## 2022-04-24 LAB — BASIC METABOLIC PANEL
BUN/Creatinine Ratio: 20 (ref 9–23)
BUN: 15 mg/dL (ref 6–24)
CO2: 24 mmol/L (ref 20–29)
Calcium: 9.3 mg/dL (ref 8.7–10.2)
Chloride: 102 mmol/L (ref 96–106)
Creatinine, Ser: 0.74 mg/dL (ref 0.57–1.00)
Glucose: 91 mg/dL (ref 70–99)
Potassium: 4.1 mmol/L (ref 3.5–5.2)
Sodium: 142 mmol/L (ref 134–144)
eGFR: 94 mL/min/{1.73_m2} (ref >59)

## 2022-04-24 LAB — HEMOGLOBIN A1C
Est. average glucose Bld gHb Est-mCnc: 131 mg/dL
Hgb A1c MFr Bld: 6.2 % — ABNORMAL HIGH (ref 4.8–5.6)

## 2022-04-24 MED ORDER — LEVOTHYROXINE SODIUM 150 MCG PO TABS: 150.0000 ug | ORAL_TABLET | Freq: Every morning | ORAL | 2 refills | Status: DC

## 2022-05-17 ENCOUNTER — Other Ambulatory Visit: Payer: Self-pay | Admitting: Family

## 2022-05-17 DIAGNOSIS — G4700 Insomnia, unspecified: Secondary | ICD-10-CM

## 2022-05-18 NOTE — Telephone Encounter (Signed)
Order complete. 

## 2022-06-02 ENCOUNTER — Other Ambulatory Visit: Payer: Self-pay | Admitting: Family

## 2022-06-02 DIAGNOSIS — G4700 Insomnia, unspecified: Secondary | ICD-10-CM

## 2022-06-02 NOTE — Telephone Encounter (Signed)
Requested medication (s) are due for refill today: na   Requested medication (s) are on the active medication list: yes   Last refill:  05/21/22-08/19/22 #30 2 refills  Future visit scheduled: no   Notes to clinic:  Pharmacy comment: REQUEST FOR 90 DAYS PRESCRIPTION.  Do you want to refill 90 day supply?     Requested Prescriptions  Pending Prescriptions Disp Refills   traZODone (DESYREL) 50 MG tablet [Pharmacy Med Name: TRAZODONE 50 MG TABLET] 90 tablet 1    Sig: TAKE 1 TABLET BY MOUTH EVERYDAY AT BEDTIME     Psychiatry: Antidepressants - Serotonin Modulator Passed - 06/02/2022  4:40 PM      Passed - Valid encounter within last 6 months    Recent Outpatient Visits           1 month ago Primary hypertension   [facility] at [facility], Amy J, NP   6 months ago Essential (primary) hypertension   Primary Care at [facility], Amy J, NP   11 months ago Hypothyroidism, unspecified type   Primary Care at [facility], Kriste Basque, NP   1 year ago Encounter to establish care   Primary Care at [facility], Kriste Basque, NP

## 2022-06-03 ENCOUNTER — Other Ambulatory Visit: Payer: Self-pay

## 2022-06-03 DIAGNOSIS — G4700 Insomnia, unspecified: Secondary | ICD-10-CM

## 2022-06-03 MED ORDER — TRAZODONE HCL 50 MG PO TABS: ORAL_TABLET | ORAL | 0 refills | Status: DC

## 2022-09-13 ENCOUNTER — Other Ambulatory Visit: Payer: Self-pay | Admitting: Family

## 2022-09-13 DIAGNOSIS — E039 Hypothyroidism, unspecified: Secondary | ICD-10-CM

## 2022-09-13 NOTE — Telephone Encounter (Signed)
Complete

## 2022-09-23 IMAGING — CT CT ABD-PELV W/ CM
2 of 5 series · 16 of 46 positions shown, 18 images · IV contrast (Omnipaque)
Comparison: None.

CLINICAL DATA: Acute abdominal pain.

EXAM:
CT ABDOMEN AND PELVIS WITH CONTRAST
TECHNIQUE: Multidetector CT imaging of the abdomen and pelvis was performed
using the standard protocol following bolus administration of
intravenous contrast.
CONTRAST:  100mL OMNIPAQUE IOHEXOL 300 MG/ML  SOLN

[Series 2: axial st · axial · 0.98mm/px · z∈[-470,-96]mm · 13 of 85 slices shown, 15 images]
[im 5/85  soft-tissue]
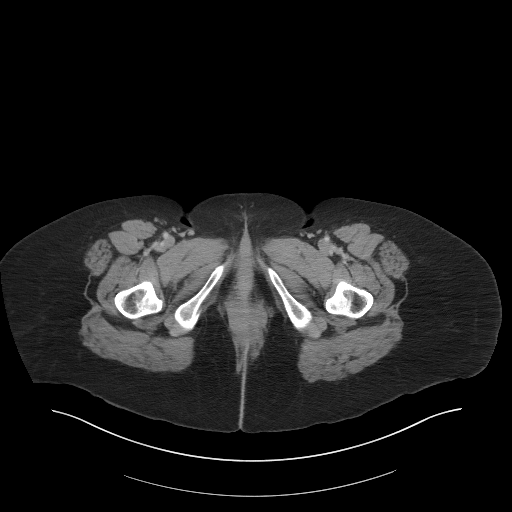
[im 5/85  bone]
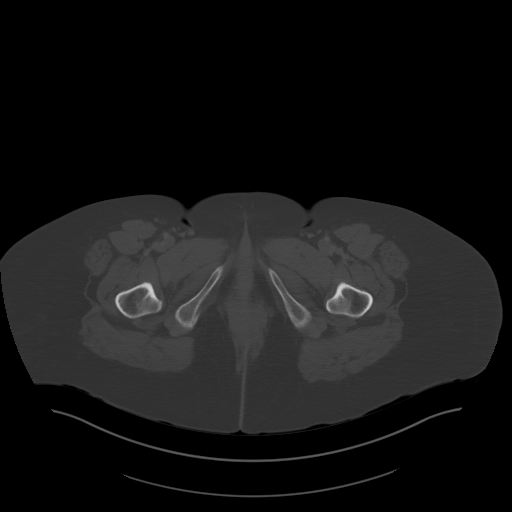
[im 13/85  soft-tissue]
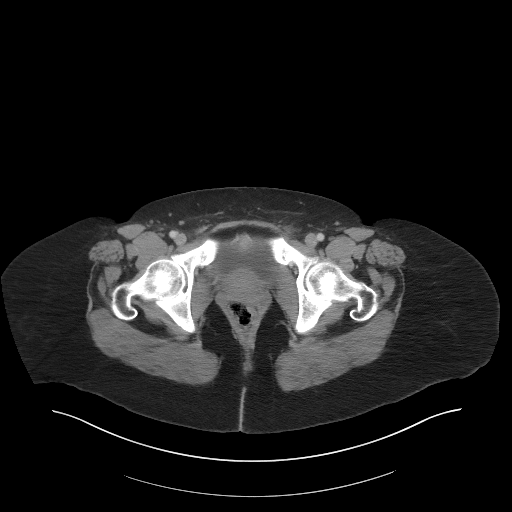
[im 17/85  soft-tissue]
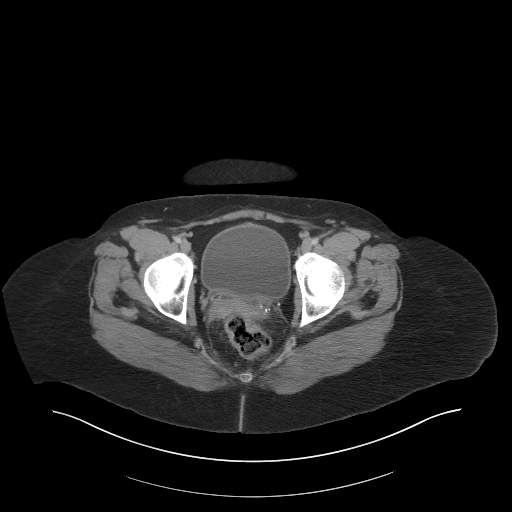
[im 26/85  soft-tissue]
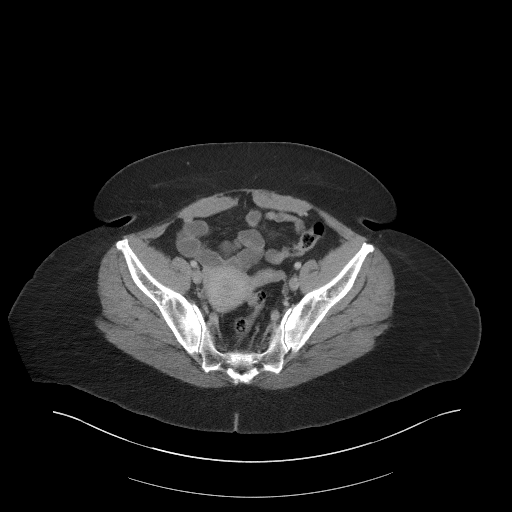
[im 30/85  soft-tissue]
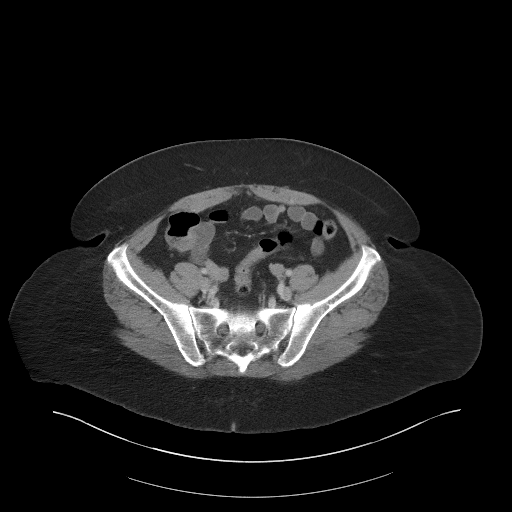
[im 38/85  soft-tissue]
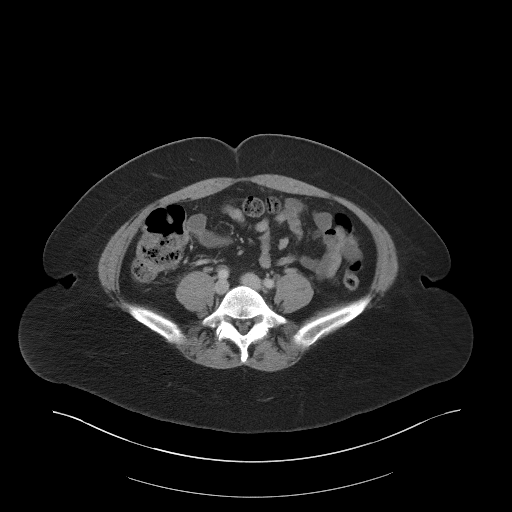
[im 43/85  soft-tissue]
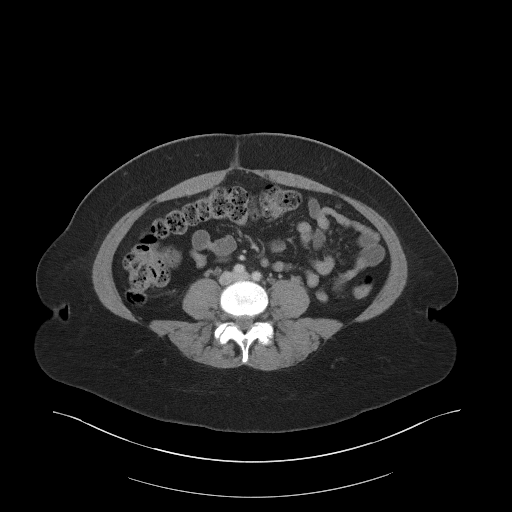
[im 47/85  soft-tissue]
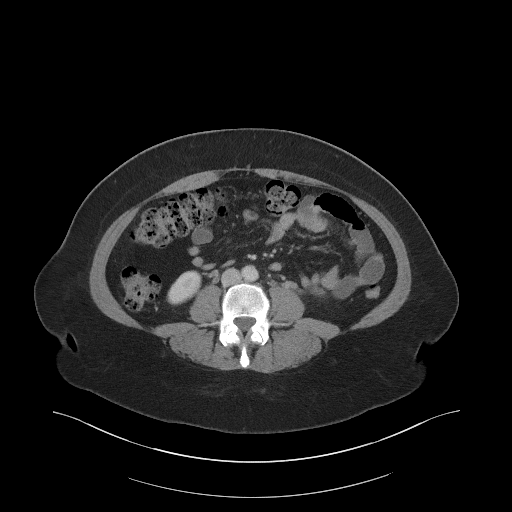
[im 55/85  soft-tissue]
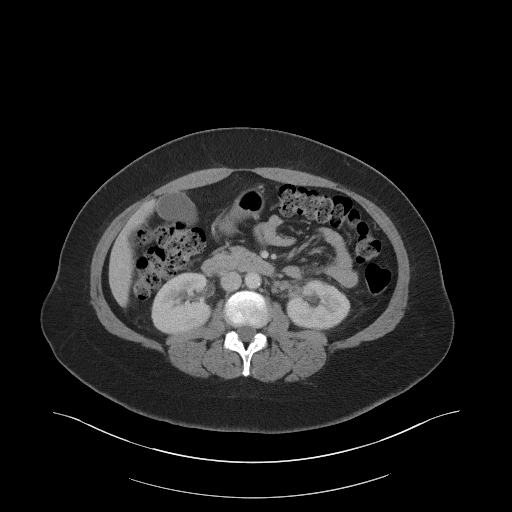
[im 55/85  bone]
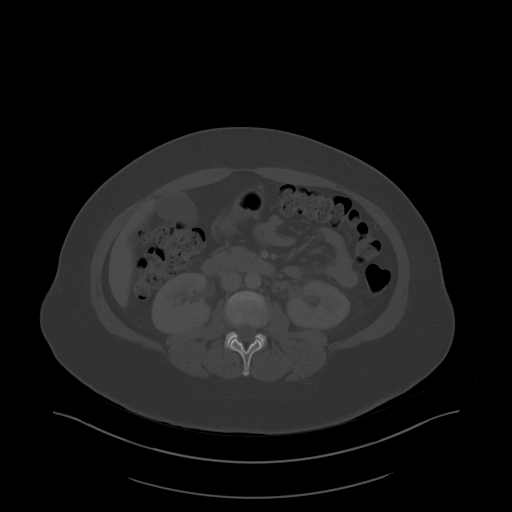
[im 59/85  soft-tissue]
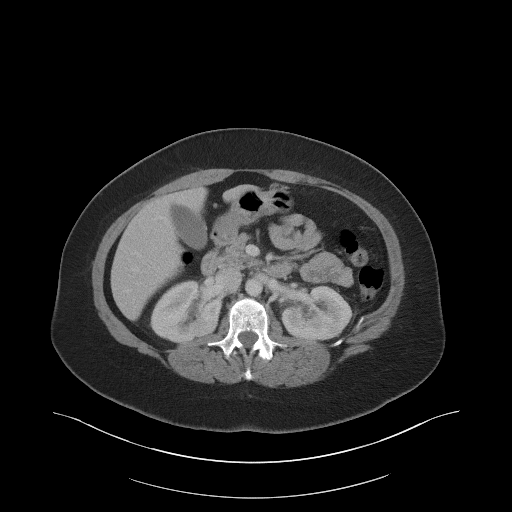
[im 68/85  soft-tissue]
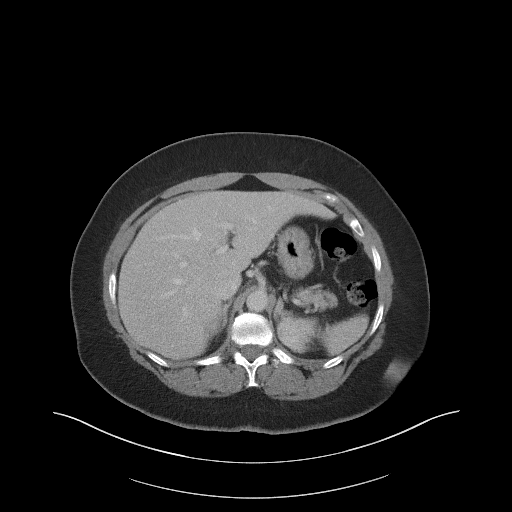
[im 72/85  soft-tissue]
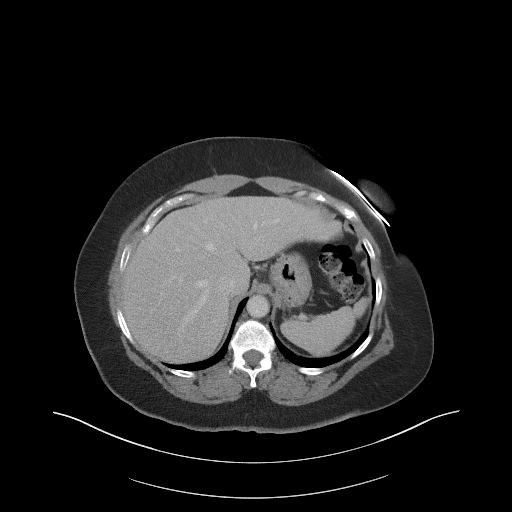
[im 80/85  soft-tissue]
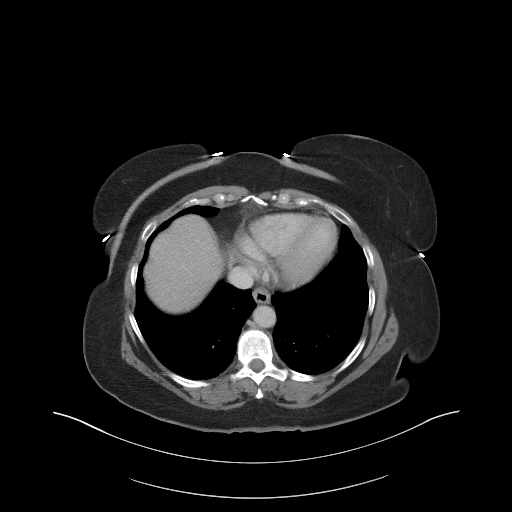

[Series 5: coronal st · coronal · 0.73mm/px · 3 of 88 slices shown]
[im 30/88  soft-tissue]
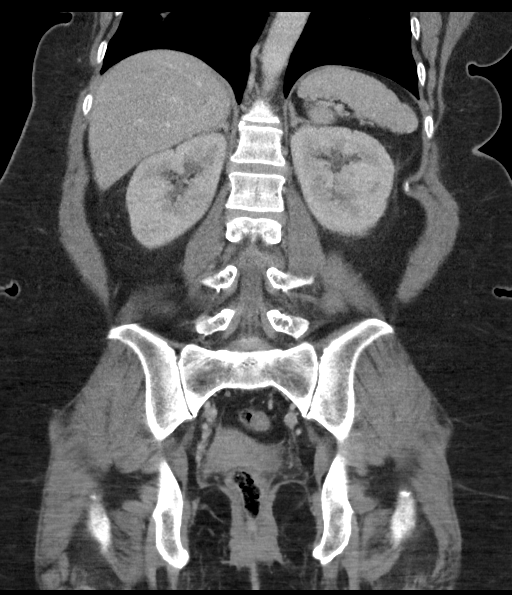
[im 39/88  soft-tissue]
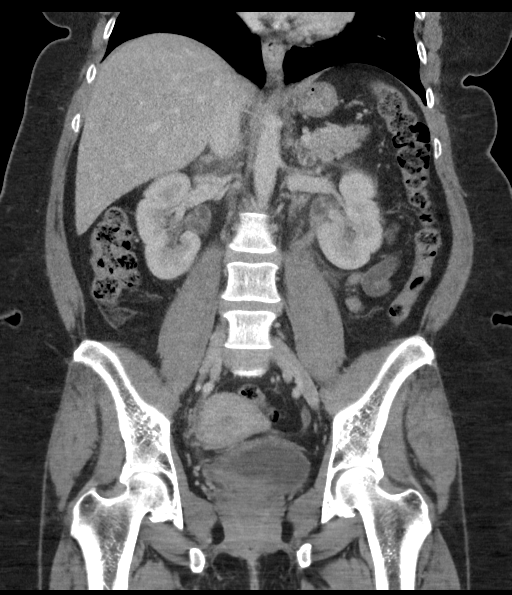
[im 49/88  soft-tissue]
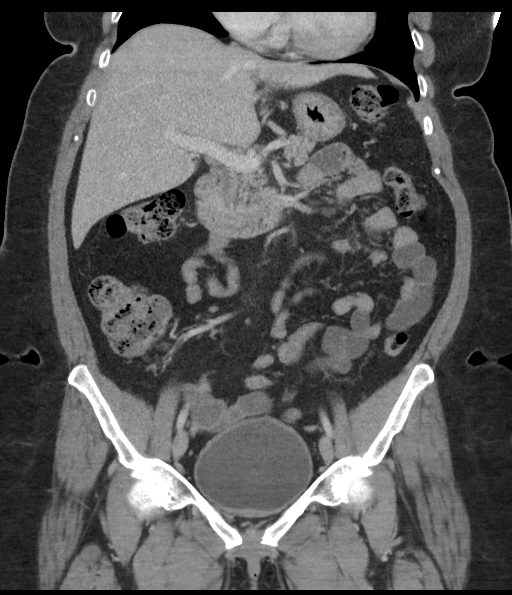

[16 of 46 positions shown; findings below may reference images not displayed]

FINDINGS: Lower chest: No acute abnormality.

Hepatobiliary: No focal liver abnormality is seen. No gallstones,
gallbladder wall thickening, or biliary dilatation.

Pancreas: Unremarkable. No pancreatic ductal dilatation or
surrounding inflammatory changes.

Spleen: Normal in size without focal abnormality.

Adrenals/Urinary Tract: On delayed imaging there is minimal patchy
cortical hypodensity in both kidneys. There is mild left perinephric
fat stranding. There is mild bilateral ureteral enhancement. There
is no hydronephrosis or perinephric fluid collection. The bladder is
within normal limits.

Stomach/Bowel: Stomach is within normal limits. Appendix is not
visualized. No evidence of bowel wall thickening, distention, or
inflammatory changes.

Vascular/Lymphatic: No significant vascular findings are present. No
enlarged abdominal or pelvic lymph nodes.

Reproductive: Uterus and bilateral adnexa are unremarkable.

Other: No abdominal wall hernia or abnormality. No abdominopelvic
ascites.

Musculoskeletal: No acute or significant osseous findings.
IMPRESSION: 1. Findings suggestive of bilateral pyelonephritis. No
hydronephrosis. If there are no clinical signs for infection,
infiltrating renal lesions are not excluded.

## 2022-11-19 ENCOUNTER — Other Ambulatory Visit (HOSPITAL_COMMUNITY): Payer: Self-pay | Admitting: Family Medicine

## 2022-11-19 ENCOUNTER — Ambulatory Visit (HOSPITAL_COMMUNITY)
Admission: RE | Admit: 2022-11-19 | Discharge: 2022-11-19 | Disposition: A | Discharge status: Home / Self Care | Payer: Managed Care, Other (non HMO) | Source: Ambulatory Visit | Attending: Family Medicine | Admitting: Family Medicine

## 2022-11-19 DIAGNOSIS — R7611 Nonspecific reaction to tuberculin skin test without active tuberculosis: Secondary | ICD-10-CM

## 2022-12-15 ENCOUNTER — Other Ambulatory Visit: Payer: Self-pay | Admitting: Family

## 2022-12-15 DIAGNOSIS — E039 Hypothyroidism, unspecified: Secondary | ICD-10-CM

## 2022-12-17 ENCOUNTER — Telehealth: Payer: Self-pay | Admitting: Family

## 2022-12-17 NOTE — Telephone Encounter (Signed)
Copied from CRM [id number]. 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  She says that she can't do that and needs a doctors note.  CB#  [phone]

## 2022-12-20 ENCOUNTER — Other Ambulatory Visit: Payer: Self-pay | Admitting: Family

## 2022-12-20 DIAGNOSIS — M25569 Pain in unspecified knee: Secondary | ICD-10-CM

## 2022-12-20 NOTE — Telephone Encounter (Signed)
Referral to Orthopedics for evaluation/management. Expect call soon with appointment details.

## 2023-02-24 ENCOUNTER — Other Ambulatory Visit (HOSPITAL_COMMUNITY): Payer: Self-pay

## 2023-02-24 ENCOUNTER — Other Ambulatory Visit: Payer: Self-pay | Admitting: Family

## 2023-02-24 DIAGNOSIS — E039 Hypothyroidism, unspecified: Secondary | ICD-10-CM

## 2023-02-24 MED ORDER — LEVOTHYROXINE SODIUM 150 MCG PO TABS
150.0000 ug | ORAL_TABLET | Freq: Every morning | ORAL | 0 refills | Status: DC
  Filled 2023-02-24: qty 90, 90d supply, fill #0

## 2023-02-24 NOTE — Telephone Encounter (Signed)
Medication Refill - Medication:  levothyroxine (SYNTHROID) 150 MCG tablet  Has the patient contacted their pharmacy? No. Pt scheduled a cpe, she needed appt, and first available was 05/06/2023.  Pt will need 60 day supply to get her through to her appt. Preferred Pharmacy (with phone number or street name): [facility]  Has the patient been seen for an appointment in the last year OR does the patient have an upcoming appointment? Yes.    Agent: Please be advised that RX refills may take up to 3 business days. We ask that you follow-up with your pharmacy.

## 2023-02-24 NOTE — Telephone Encounter (Signed)
Requested Prescriptions  Pending Prescriptions Disp Refills   levothyroxine (SYNTHROID) 150 MCG tablet 90 tablet 0    Sig: Take 1 tablet (150 mcg total) by mouth every morning.     Endocrinology:  Hypothyroid Agents Passed - 02/24/2023 11:54 AM      Passed - TSH in normal range and within 360 days    TSH  Date Value Ref Range Status  04/23/2022 2.790 0.450 - 4.500 uIU/mL Final         Passed - Valid encounter within last 12 months    Recent Outpatient Visits           10 months ago Primary hypertension   [place] [facility], [place], NP   1 year ago Essential (primary) hypertension   [place] [facility], [place], [place]   1 year ago Hypothyroidism, unspecified type   [facility], Gildardo Pounds, NP   1 year ago Encounter to establish care   [facility] at [facility], Gildardo Pounds, NP       Future Appointments             In 2 months Rema Fendt, NP [facility] at [facility]

## 2023-02-28 ENCOUNTER — Other Ambulatory Visit (HOSPITAL_COMMUNITY): Payer: Self-pay

## 2023-05-06 ENCOUNTER — Other Ambulatory Visit (HOSPITAL_COMMUNITY): Payer: Self-pay

## 2023-05-06 ENCOUNTER — Ambulatory Visit (INDEPENDENT_AMBULATORY_CARE_PROVIDER_SITE_OTHER): Payer: 59 | Admitting: Family

## 2023-05-06 ENCOUNTER — Other Ambulatory Visit: Payer: Self-pay

## 2023-05-06 VITALS — BP 131/83 | HR 73 | Temp 98.2°F | Ht 63.0 in | Wt 218.8 lb

## 2023-05-06 DIAGNOSIS — Z131 Encounter for screening for diabetes mellitus: Secondary | ICD-10-CM

## 2023-05-06 DIAGNOSIS — Z130 Encounter for screening for diseases of the blood and blood-forming organs and certain disorders involving the immune mechanism: Secondary | ICD-10-CM

## 2023-05-06 DIAGNOSIS — Z1211 Encounter for screening for malignant neoplasm of colon: Secondary | ICD-10-CM | POA: Diagnosis not present

## 2023-05-06 DIAGNOSIS — Z0000 Encounter for general adult medical examination without abnormal findings: Secondary | ICD-10-CM | POA: Diagnosis not present

## 2023-05-06 DIAGNOSIS — E039 Hypothyroidism, unspecified: Secondary | ICD-10-CM | POA: Diagnosis not present

## 2023-05-06 DIAGNOSIS — I10 Essential (primary) hypertension: Secondary | ICD-10-CM | POA: Diagnosis not present

## 2023-05-06 DIAGNOSIS — Z13228 Encounter for screening for other metabolic disorders: Secondary | ICD-10-CM | POA: Diagnosis not present

## 2023-05-06 DIAGNOSIS — Z1231 Encounter for screening mammogram for malignant neoplasm of breast: Secondary | ICD-10-CM | POA: Diagnosis not present

## 2023-05-06 DIAGNOSIS — Z124 Encounter for screening for malignant neoplasm of cervix: Secondary | ICD-10-CM | POA: Diagnosis not present

## 2023-05-06 DIAGNOSIS — Z13220 Encounter for screening for lipoid disorders: Secondary | ICD-10-CM | POA: Diagnosis not present

## 2023-05-06 MED ORDER — LISINOPRIL-HYDROCHLOROTHIAZIDE 10-12.5 MG PO TABS
1.0000 | ORAL_TABLET | Freq: Every day | ORAL | 0 refills | Status: DC
  Filled 2023-05-06: qty 90, 90d supply, fill #0

## 2023-05-06 MED ORDER — LEVOTHYROXINE SODIUM 150 MCG PO TABS
150.0000 ug | ORAL_TABLET | Freq: Every morning | ORAL | 0 refills | Status: DC
  Filled 2023-05-06 – 2023-05-11 (×2): qty 90, 90d supply, fill #0

## 2023-05-06 NOTE — Progress Notes (Signed)
Patient states she is just here to have her thyroid medication refilled.

## 2023-05-06 NOTE — Progress Notes (Signed)
Patient ID: Michelle Garner, female    DOB: 09/03/1965  MRN: [id number]  CC: Annual Exam  Subjective: Michelle Garner is a 57 y.o. female who presents for annual exam.   Her concerns today include:  - States she only takes Lisinopril-Hydrochlorothiazide if blood pressure above goal. States she prefers natural remedies to help with high blood pressure. She does not complain of red flag symptoms such as but not limited to chest pain, shortness of breath, worst headache of life, nausea/vomiting.  - Doing well on Levothyroxine, no issues/concerns.   Patient Active Problem List   Diagnosis Date Noted   Prediabetes 04/24/2022   Dehydration    Hypokalemia    Nausea and vomiting    ESBL (extended spectrum beta-lactamase) producing bacteria infection    Pyelonephritis 03/18/2021   Class 2 obesity due to excess calories in adult 08/08/2019   Low vitamin D level 02/06/2014   Acquired hypothyroidism 02/06/2014   Benign essential HTN 02/06/2014   Hyperlipidemia 02/06/2014     Current Outpatient Medications on File Prior to Visit  Medication Sig Dispense Refill   Multiple Vitamin (MULTIVITAMIN WITH MINERALS) TABS tablet Take 1 tablet by mouth daily.     No current facility-administered medications on file prior to visit.    No Known Allergies  Social History   Socioeconomic History   Marital status: Divorced    Spouse name: Not on file   Number of children: Not on file   Years of education: Not on file   Highest education level: Not on file  Occupational History   Not on file  Tobacco Use   Smoking status: Never    Passive exposure: Past   Smokeless tobacco: Never  Vaping Use   Vaping status: Never Used  Substance and Sexual Activity   Alcohol use: Never   Drug use: Never   Sexual activity: Not on file  Other Topics Concern   Not on file  Social History Narrative   Not on file   Social Drivers of Health   Financial Resource Strain: Not on file  Food Insecurity:  Not on file  Transportation Needs: Not on file  Physical Activity: Not on file  Stress: Not on file  Social Connections: Not on file  Intimate Partner Violence: Not on file    No family history on file.  Past Surgical History:  Procedure Laterality Date   CESAREAN SECTION      ROS: Review of Systems Negative except as stated above  PHYSICAL EXAM: BP 131/83   Pulse 73   Temp 98.2 F (36.8 C) (Oral)   Ht 5\' 3"  (1.6 m)   Wt 218 lb 12.8 oz (99.2 kg)   SpO2 97%   BMI 38.76 kg/m   Physical Exam HENT:     Head: Normocephalic and atraumatic.     Right Ear: Tympanic membrane, ear canal and external ear normal.     Left Ear: Tympanic membrane, ear canal and external ear normal.     Nose: Nose normal.     Mouth/Throat:     Mouth: Mucous membranes are moist.     Pharynx: Oropharynx is clear.  Eyes:     Extraocular Movements: Extraocular movements intact.     Conjunctiva/sclera: Conjunctivae normal.     Pupils: Pupils are equal, round, and reactive to light.  Neck:     Thyroid: No thyroid mass, thyromegaly or thyroid tenderness.  Cardiovascular:     Rate and Rhythm: Normal rate and regular rhythm.  Pulses: Normal pulses.     Heart sounds: Normal heart sounds.  Pulmonary:     Effort: Pulmonary effort is normal.     Breath sounds: Normal breath sounds.  Chest:     Comments: Patient declined.  Abdominal:     General: Bowel sounds are normal.     Palpations: Abdomen is soft.  Genitourinary:    Comments: Patient declined.  Musculoskeletal:        General: Normal range of motion.     Right shoulder: Normal.     Left shoulder: Normal.     Right upper arm: Normal.     Left upper arm: Normal.     Right elbow: Normal.     Left elbow: Normal.     Right forearm: Normal.     Left forearm: Normal.     Right wrist: Normal.     Left wrist: Normal.     Right hand: Normal.     Left hand: Normal.     Cervical back: Normal, normal range of motion and neck supple.      Thoracic back: Normal.     Lumbar back: Normal.     Right hip: Normal.     Left hip: Normal.     Right upper leg: Normal.     Left upper leg: Normal.     Right knee: Normal.     Left knee: Normal.     Right lower leg: Normal.     Left lower leg: Normal.     Right ankle: Normal.     Left ankle: Normal.     Right foot: Normal.     Left foot: Normal.  Skin:    General: Skin is warm and dry.     Capillary Refill: Capillary refill takes less than 2 seconds.  Neurological:     General: No focal deficit present.     Mental Status: She is alert and oriented to person, place, and time.  Psychiatric:        Mood and Affect: Mood normal.        Behavior: Behavior normal.     ASSESSMENT AND PLAN: 1. Annual physical exam (Primary) - Counseled on 150 minutes of exercise per week as tolerated, healthy eating (including decreased daily intake of saturated fats, cholesterol, added sugars, sodium), STI prevention, and routine healthcare maintenance.  2. Screening for metabolic disorder - Routine screening.  - CMP14+EGFR  3. Screening for deficiency anemia - Routine screening.  - CBC  4. Acquired hypothyroidism - Continue Levothyroxine as prescribed. Counseled on medication adherence/adverse effects. - Routine screening.  - Follow-up with primary provider as scheduled.  - TSH - levothyroxine (SYNTHROID) 150 MCG tablet; Take 1 tablet (150 mcg total) by mouth every morning.  Dispense: 90 tablet; Refill: 0  5. Diabetes mellitus screening - Routine screening.  - Hemoglobin A1c  6. Screening cholesterol level - Routine screening.  - Lipid panel  7. Encounter for screening mammogram for malignant neoplasm of breast - Routine screening.  - MM Digital Screening; Future  8. Primary hypertension - Continue Lisinopril-Hydrochlorothiazide as prescribed. - Counseled on blood pressure goal of less than 130/80, low-sodium, DASH diet, medication compliance, and 150 minutes of moderate  intensity exercise per week as tolerated. Counseled on medication adherence and adverse effects. - Follow-up with primary provider in 3 months or sooner if needed.  - lisinopril-hydrochlorothiazide (ZESTORETIC) 10-12.5 MG tablet; Take 1 tablet by mouth daily.  Dispense: 90 tablet; Refill: 0  9. Pap smear for cervical  cancer screening - Referral to Gynecology for evaluation/management. - Ambulatory referral to Gynecology  10. Colon cancer screening - Referral to Gastroenterology for colon cancer screening by colonoscopy. - Ambulatory referral to Gastroenterology   Patient was given the opportunity to ask questions.  Patient verbalized understanding of the plan and was able to repeat key elements of the plan. Patient was given clear instructions to go to Emergency Department or return to medical center if symptoms don't improve, worsen, or new problems develop.The patient verbalized understanding.   Orders Placed This Encounter  Procedures   MM Digital Screening   CBC   Lipid panel   CMP14+EGFR   Hemoglobin A1c   TSH   Ambulatory referral to Gynecology   Ambulatory referral to Gastroenterology     Requested Prescriptions   Signed Prescriptions Disp Refills   levothyroxine (SYNTHROID) 150 MCG tablet 90 tablet 0    Sig: Take 1 tablet (150 mcg total) by mouth every morning.   lisinopril-hydrochlorothiazide (ZESTORETIC) 10-12.5 MG tablet 90 tablet 0    Sig: Take 1 tablet by mouth daily.    Return in about 1 year (around 05/05/2024) for Physical per patient preference and 3 months chronic conditions .  Rema Fendt, NP

## 2023-05-07 LAB — CMP14+EGFR
ALT: 14 [IU]/L (ref 0–32)
AST: 17 [IU]/L (ref 0–40)
Albumin: 4.4 g/dL (ref 3.8–4.9)
Alkaline Phosphatase: 74 [IU]/L (ref 44–121)
BUN/Creatinine Ratio: 18 (ref 9–23)
BUN: 17 mg/dL (ref 6–24)
Bilirubin Total: 0.2 mg/dL (ref 0.0–1.2)
CO2: 24 mmol/L (ref 20–29)
Calcium: 10.2 mg/dL (ref 8.7–10.2)
Chloride: 103 mmol/L (ref 96–106)
Creatinine, Ser: 0.96 mg/dL (ref 0.57–1.00)
Globulin, Total: 3 g/dL (ref 1.5–4.5)
Glucose: 90 mg/dL (ref 70–99)
Potassium: 4.1 mmol/L (ref 3.5–5.2)
Sodium: 143 mmol/L (ref 134–144)
Total Protein: 7.4 g/dL (ref 6.0–8.5)
eGFR: 68 mL/min/{1.73_m2} (ref >59)

## 2023-05-07 LAB — HEMOGLOBIN A1C
Est. average glucose Bld gHb Est-mCnc: 128 mg/dL
Hgb A1c MFr Bld: 6.1 % — ABNORMAL HIGH (ref 4.8–5.6)

## 2023-05-07 LAB — LIPID PANEL
Chol/HDL Ratio: 3.5 {ratio} (ref 0.0–4.4)
Cholesterol, Total: 263 mg/dL — ABNORMAL HIGH (ref 100–199)
HDL: 76 mg/dL (ref >39)
LDL Chol Calc (NIH): 176 mg/dL — ABNORMAL HIGH (ref 0–99)
Triglycerides: 69 mg/dL (ref 0–149)
VLDL Cholesterol Cal: 11 mg/dL (ref 5–40)

## 2023-05-07 LAB — CBC
Hematocrit: 39.3 % (ref 34.0–46.6)
Hemoglobin: 12.6 g/dL (ref 11.1–15.9)
MCH: 25.8 pg — ABNORMAL LOW (ref 26.6–33.0)
MCHC: 32.1 g/dL (ref 31.5–35.7)
MCV: 81 fL (ref 79–97)
Platelets: 301 10*3/uL (ref 150–450)
RBC: 4.88 x10E6/uL (ref 3.77–5.28)
RDW: 12.6 % (ref 11.7–15.4)
WBC: 10.2 10*3/uL (ref 3.4–10.8)

## 2023-05-07 LAB — TSH: TSH: 0.06 u[IU]/mL — ABNORMAL LOW (ref 0.450–4.500)

## 2023-05-09 ENCOUNTER — Other Ambulatory Visit: Payer: Self-pay | Admitting: Family

## 2023-05-09 DIAGNOSIS — Z13220 Encounter for screening for lipoid disorders: Secondary | ICD-10-CM

## 2023-05-09 DIAGNOSIS — R7989 Other specified abnormal findings of blood chemistry: Secondary | ICD-10-CM

## 2023-05-11 ENCOUNTER — Other Ambulatory Visit (HOSPITAL_COMMUNITY): Payer: Self-pay

## 2023-10-11 ENCOUNTER — Ambulatory Visit: Admitting: Internal Medicine

## 2023-10-11 ENCOUNTER — Encounter: Payer: Self-pay | Admitting: Internal Medicine

## 2023-10-11 VITALS — BP 134/78 | HR 62 | Temp 98.1°F | Ht 63.0 in | Wt 208.8 lb

## 2023-10-11 DIAGNOSIS — I10 Essential (primary) hypertension: Secondary | ICD-10-CM | POA: Diagnosis not present

## 2023-10-11 DIAGNOSIS — E039 Hypothyroidism, unspecified: Secondary | ICD-10-CM

## 2023-10-11 DIAGNOSIS — R7303 Prediabetes: Secondary | ICD-10-CM | POA: Diagnosis not present

## 2023-10-11 DIAGNOSIS — E785 Hyperlipidemia, unspecified: Secondary | ICD-10-CM | POA: Diagnosis not present

## 2023-10-11 DIAGNOSIS — Z1231 Encounter for screening mammogram for malignant neoplasm of breast: Secondary | ICD-10-CM | POA: Diagnosis not present

## 2023-10-11 DIAGNOSIS — E559 Vitamin D deficiency, unspecified: Secondary | ICD-10-CM | POA: Diagnosis not present

## 2023-10-11 NOTE — Progress Notes (Signed)
 [facility] [address] [place] [address] Dept: [phone] Dept Fax: [phone]  New Patient Office Visit  Subjective:   Michelle Garner 25-Jan-1966 10/11/2023  Chief Complaint  Patient presents with   Establish Care    HPI: Michelle Garner presents today to establish care at [facility]. Introduced to Publishing rights manager role and practice setting.  All questions answered.  Concerns: See below   Discussed the use of AI scribe software for clinical note transcription with the patient, who gave verbal consent to proceed.  History of Present Illness   Michelle Garner is a 58 year old female with hypertension, hypothyroidism, HLD, Vitamin D deficiency, and prediabetes who presents for establishment of care and medication management.  She has a history of hypertension and is currently taking lisinopril  and hydrochlorothiazide  10 mg/12.5 mg once daily. Her blood pressure is currently well controlled, although she states there were times at home where her BP was reading elevated consistently. At that time she wasn't taking BP medication on a consistent basis. She is now taking it daily. Stress and dietary factors, particularly high sodium intake at work may also be contributing to elevated readings.  She has hypothyroidism and is on levothyroxine  150 mcg, which she personally adjusted dosing to every other day due to previously low TSH levels in December 2024. She was referred to an endocrinologist?? by her last PCP , but patient did not pursue.   She has a history of low vitamin D levels and takes an over-the-counter supplement of 2000 IU daily, sometimes taking two pills.   Her A1c levels have been monitored for prediabetes, and she has a history of high cholesterol, although she is not currently on any medication for it. She is due for fasting blood work to reassess these  conditions.  She mentions a past mammogram where the procedure was unsatisfactory, leading to a request for a repeat test. Last mammogram was over 1 year ago.   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 She works two days a week and finds the work manageable within this schedule.       The following portions of the patient's history were reviewed and updated as appropriate: past medical history, past surgical history, family history, social history, allergies, medications, and problem list.   Patient Active Problem List   Diagnosis Date Noted   Prediabetes 04/24/2022   Hypokalemia    ESBL (extended spectrum beta-lactamase) producing bacteria infection    Pyelonephritis 03/18/2021   Class 2 obesity due to excess calories in adult 08/08/2019   Low vitamin D level 02/06/2014   Acquired hypothyroidism 02/06/2014   Benign essential HTN 02/06/2014   Hyperlipidemia 02/06/2014   Past Medical History:  Diagnosis Date   Hypertension    Hypothyroid    Thyroid  disease    Past Surgical History:  Procedure Laterality Date   CESAREAN SECTION     History reviewed. No pertinent family history.  Current Outpatient Medications:    Cholecalciferol (VITAMIN D3) 50 MCG (2000 UT) capsule, Take 2,000 Units by mouth daily., Disp: , Rfl:    levothyroxine  (SYNTHROID ) 150 MCG tablet, Take 1 tablet (150 mcg total) by mouth every morning., Disp: 90 tablet, Rfl: 0   Multiple Vitamin (MULTIVITAMIN WITH MINERALS) TABS tablet, Take 1 tablet by mouth daily., Disp: , Rfl:  lisinopril -hydrochlorothiazide  (ZESTORETIC ) 10-12.5 MG tablet, Take 1 tablet by mouth daily., Disp: 90 tablet, Rfl: 0 No Known Allergies  ROS: A complete ROS was performed with pertinent positives/negatives noted in the HPI. The remainder of the ROS are negative.   Objective:   Today's Vitals   10/11/23 1432  BP: 134/78  Pulse: 62   Temp: 98.1 F (36.7 C)  TempSrc: Temporal  SpO2: 100%  Weight: 208 lb 12.8 oz (94.7 kg)  Height: 5\' 3"  (1.6 m)    GENERAL: Well-appearing, in NAD. Well nourished.  SKIN: Pink, warm and dry. No rash, lesion, ulceration, or ecchymoses.  NECK: Trachea midline. Full ROM w/o pain or tenderness. No lymphadenopathy. No thyromegaly or palpable masses.  RESPIRATORY: Chest wall symmetrical. Respirations even and non-labored. Breath sounds clear to auscultation bilaterally.  CARDIAC: S1, S2 present, regular rate and rhythm. Peripheral pulses 2+ bilaterally.  EXTREMITIES: Without clubbing, cyanosis, or edema.  NEUROLOGIC: Steady, even gait.  PSYCH/MENTAL STATUS: Alert, oriented x 3. Cooperative, appropriate mood and affect.   Health Maintenance Due  Topic Date Due   Colonoscopy  Never done   MAMMOGRAM  Never done    No results found for any visits on 10/11/23.  Assessment & Plan:   Assessment and Plan    Hypertension Hypertension well-controlled on current medication regimen. - Continue lisinopril  and hydrochlorothiazide  10-12.5 mg PO daily. - CMP  Acquired hypothyroidism Acquired hypothyroidism with recent abnormal TSH levels. Awaiting lab results for potential dosage adjustment. - Check TSH and T4 levels. - Continue levothyroxine  150 mcg every other day until lab results are reviewed.  Hyperlipidemia Hyperlipidemia with no current medication. Awaiting fasting lipid panel for further assessment. - Schedule fasting lipid panel within 1-2 weeks.  Prediabetes Prediabetes with previous monitoring of A1c levels. No current medication. - Check A1c level.  Vitamin D deficiency Vitamin D deficiency managed with over-the-counter supplements. - Check vitamin D level. - Continue vitamin D 2000 units once daily.  Breast cancer screening Due for a mammogram. Previous mammogram inconclusive due to technical issues. - Order mammogram and contact to schedule.       Orders Placed This  Encounter  Procedures   MM 3D SCREENING MAMMOGRAM BILATERAL BREAST    Standing Status:   Future    Expiration Date:   10/10/2024    Reason for Exam (SYMPTOM  OR DIAGNOSIS REQUIRED):   screening for breast cancer    Preferred [facility]    Is the patient pregnant?:   No   Comprehensive metabolic panel with GFR    Standing Status:   Future    Expiration Date:   10/10/2024   TSH    Standing Status:   Future    Expiration Date:   10/10/2024   Hemoglobin A1C    Standing Status:   Future    Expiration Date:   10/10/2024   T4, free    Standing Status:   Future    Expiration Date:   10/10/2024   VITAMIN D 25 Hydroxy (Vit-D Deficiency, Fractures)    Standing Status:   Future    Expiration Date:   10/10/2024   Lipid panel    Standing Status:   Future    Expiration Date:   10/10/2024   No orders of the defined types were placed in this encounter.   Return in about 7 months (around 05/12/2024) for Annual Physical Exam with fasting lab work.   Gavin Kast, FNP

## 2023-10-28 ENCOUNTER — Other Ambulatory Visit

## 2023-10-28 DIAGNOSIS — R7303 Prediabetes: Secondary | ICD-10-CM | POA: Diagnosis not present

## 2023-10-28 DIAGNOSIS — E039 Hypothyroidism, unspecified: Secondary | ICD-10-CM | POA: Diagnosis not present

## 2023-10-28 DIAGNOSIS — E559 Vitamin D deficiency, unspecified: Secondary | ICD-10-CM

## 2023-10-28 DIAGNOSIS — I10 Essential (primary) hypertension: Secondary | ICD-10-CM | POA: Diagnosis not present

## 2023-10-28 DIAGNOSIS — E785 Hyperlipidemia, unspecified: Secondary | ICD-10-CM

## 2023-10-28 LAB — LIPID PANEL
Cholesterol: 256 mg/dL — ABNORMAL HIGH (ref 0–200)
HDL: 67.3 mg/dL (ref >39.00)
LDL Cholesterol: 177 mg/dL — ABNORMAL HIGH (ref 0–99)
NonHDL: 188.54
Total CHOL/HDL Ratio: 4
Triglycerides: 58 mg/dL (ref 0.0–149.0)
VLDL: 11.6 mg/dL (ref 0.0–40.0)

## 2023-10-28 LAB — COMPREHENSIVE METABOLIC PANEL WITH GFR
ALT: 12 U/L (ref 0–35)
AST: 17 U/L (ref 0–37)
Albumin: 4.2 g/dL (ref 3.5–5.2)
Alkaline Phosphatase: 56 U/L (ref 39–117)
BUN: 15 mg/dL (ref 6–23)
CO2: 28 meq/L (ref 19–32)
Calcium: 9.3 mg/dL (ref 8.4–10.5)
Chloride: 106 meq/L (ref 96–112)
Creatinine, Ser: 0.9 mg/dL (ref 0.40–1.20)
GFR: 69.82 mL/min (ref >60.00)
Glucose, Bld: 99 mg/dL (ref 70–99)
Potassium: 4.1 meq/L (ref 3.5–5.1)
Sodium: 140 meq/L (ref 135–145)
Total Bilirubin: 0.3 mg/dL (ref 0.2–1.2)
Total Protein: 7.5 g/dL (ref 6.0–8.3)

## 2023-10-28 LAB — VITAMIN D 25 HYDROXY (VIT D DEFICIENCY, FRACTURES): VITD: 31.71 ng/mL (ref 30.00–100.00)

## 2023-10-28 LAB — TSH: TSH: 8.34 u[IU]/mL — ABNORMAL HIGH (ref 0.35–5.50)

## 2023-10-28 LAB — HEMOGLOBIN A1C: Hgb A1c MFr Bld: 6.1 % (ref 4.6–6.5)

## 2023-10-28 LAB — T4, FREE: Free T4: 0.83 ng/dL (ref 0.60–1.60)

## 2023-11-04 ENCOUNTER — Telehealth: Payer: Self-pay

## 2023-11-04 ENCOUNTER — Ambulatory Visit: Payer: Self-pay | Admitting: Internal Medicine

## 2023-11-04 DIAGNOSIS — E039 Hypothyroidism, unspecified: Secondary | ICD-10-CM

## 2023-11-04 NOTE — Telephone Encounter (Signed)
 Copied from CRM [id number]. Topic: Clinical - Lab/Test Results >> Nov 04, 2023  1:12 PM Turkey A wrote: Reason for CRM: Agent read lab results verbatim-patient said she has never been on medication for Cholesterol and wants to diet and exercise instead of medication. Patient also stated she would call back to schedule 6 week lab appointment because the work schedule is not posted as of yet

## 2023-12-02 ENCOUNTER — Other Ambulatory Visit: Payer: Self-pay | Admitting: Internal Medicine

## 2023-12-02 ENCOUNTER — Other Ambulatory Visit: Payer: Self-pay | Admitting: Family

## 2023-12-02 DIAGNOSIS — E039 Hypothyroidism, unspecified: Secondary | ICD-10-CM

## 2023-12-02 DIAGNOSIS — I10 Essential (primary) hypertension: Secondary | ICD-10-CM

## 2023-12-02 NOTE — Telephone Encounter (Signed)
 Last Ov 10/11/23 Filled by historical provider

## 2023-12-02 NOTE — Telephone Encounter (Unsigned)
 Copied from CRM [id number]. Topic: Clinical - Medication Refill >> Dec 02, 2023 11:28 AM Abigail D wrote: Medication: levothyroxine  (SYNTHROID ) 150 MCG tablet [[id number]]  Has the patient contacted their pharmacy? No (Agent: If no, request that the patient contact the pharmacy for the refill. If patient does not wish to contact the pharmacy document the reason why and proceed with request.) (Agent: If yes, when and what did the pharmacy advise?)  This is the patient's preferred pharmacy:  [facility] [address] [place] [address] Phone: [phone] Fax: [phone]  Is this the correct pharmacy for this prescription? Yes If no, delete pharmacy and type the correct one.   Has the prescription been filled recently? No  Is the patient out of the medication? No  Has the patient been seen for an appointment in the last year OR does the patient have an upcoming appointment? Yes  Can we respond through MyChart? Yes  Agent: Please be advised that Rx refills may take up to 3 business days. We ask that you follow-up with your pharmacy.

## 2023-12-05 ENCOUNTER — Other Ambulatory Visit (HOSPITAL_COMMUNITY): Payer: Self-pay

## 2023-12-05 MED ORDER — LEVOTHYROXINE SODIUM 150 MCG PO TABS
150.0000 ug | ORAL_TABLET | Freq: Every morning | ORAL | 0 refills | Status: DC
  Filled 2023-12-05: qty 90, 90d supply, fill #0

## 2023-12-09 ENCOUNTER — Other Ambulatory Visit (HOSPITAL_COMMUNITY): Payer: Self-pay

## 2023-12-09 ENCOUNTER — Other Ambulatory Visit: Payer: Self-pay | Admitting: Internal Medicine

## 2023-12-09 DIAGNOSIS — I10 Essential (primary) hypertension: Secondary | ICD-10-CM

## 2023-12-09 NOTE — Telephone Encounter (Signed)
 Last Ov 10/11/23 Filled by historical provider

## 2023-12-13 ENCOUNTER — Other Ambulatory Visit (HOSPITAL_COMMUNITY): Payer: Self-pay

## 2023-12-13 MED ORDER — LISINOPRIL-HYDROCHLOROTHIAZIDE 10-12.5 MG PO TABS
1.0000 | ORAL_TABLET | Freq: Every day | ORAL | 2 refills | Status: AC
  Filled 2023-12-13: qty 90, 90d supply, fill #0

## 2023-12-23 ENCOUNTER — Other Ambulatory Visit (HOSPITAL_COMMUNITY): Payer: Self-pay

## 2024-01-16 ENCOUNTER — Other Ambulatory Visit (INDEPENDENT_AMBULATORY_CARE_PROVIDER_SITE_OTHER)

## 2024-01-16 DIAGNOSIS — E039 Hypothyroidism, unspecified: Secondary | ICD-10-CM | POA: Diagnosis not present

## 2024-01-16 LAB — TSH: TSH: 1.5 u[IU]/mL (ref 0.35–5.50)

## 2024-01-17 ENCOUNTER — Ambulatory Visit: Payer: Self-pay | Admitting: Internal Medicine

## 2024-01-17 ENCOUNTER — Encounter: Payer: Self-pay | Admitting: Internal Medicine

## 2024-04-26 ENCOUNTER — Other Ambulatory Visit: Payer: Self-pay | Admitting: Internal Medicine

## 2024-04-26 DIAGNOSIS — E039 Hypothyroidism, unspecified: Secondary | ICD-10-CM

## 2024-04-27 ENCOUNTER — Telehealth: Payer: Self-pay | Admitting: Internal Medicine

## 2024-04-27 NOTE — Telephone Encounter (Signed)
 Noted. Patient is scheduled to have a New Patient appt with Rock Bruns on 08/10/2024. Will CC current PCP to make them aware also.    Copied from CRM [id number]. Topic: Appointments - Transfer of Care >> Apr 27, 2024  1:48 PM Sophia H wrote: Pt is requesting to transfer FROM: FNP Rosina Senters  Pt is requesting to transfer TO: FNP Rock Bruns Reason for requested transfer: Patient has moved - needs TOC to closer provider.  It is the responsibility of the team the patient would like to transfer to (FNP Rock Bruns) to reach out to the patient if for any reason this transfer is not acceptable.

## 2024-05-01 MED ORDER — LEVOTHYROXINE SODIUM 150 MCG PO TABS
150.0000 ug | ORAL_TABLET | Freq: Every morning | ORAL | 0 refills | Status: AC
  Filled 2024-05-01: qty 90, 90d supply, fill #0

## 2024-05-01 NOTE — Telephone Encounter (Signed)
 Refill request received for Levothyroxine  150mg  FOV:05/14/2024 LOV:10/11/2023 Last refill: 12/05/2023 Medication is pending your approval.

## 2024-05-02 ENCOUNTER — Other Ambulatory Visit (HOSPITAL_COMMUNITY): Payer: Self-pay

## 2024-05-04 ENCOUNTER — Other Ambulatory Visit (HOSPITAL_COMMUNITY): Payer: Self-pay

## 2024-05-08 ENCOUNTER — Encounter: Payer: 59 | Admitting: Family

## 2024-05-14 ENCOUNTER — Encounter: Admitting: Internal Medicine

## 2024-08-10 ENCOUNTER — Encounter: Admitting: Family Medicine
